# Patient Record
Sex: Female | Born: 1960 | Race: Black or African American | Hispanic: No | Marital: Single | Smoking: Never smoker
Health system: Southern US, Community
[De-identification: ages and names within clinical notes are randomized; demographics above are authoritative.]

## PROBLEM LIST (undated history)

## (undated) DIAGNOSIS — I10 Essential (primary) hypertension: Secondary | ICD-10-CM

## (undated) HISTORY — PX: TONSILLECTOMY: SUR1361

---

## 2006-06-22 ENCOUNTER — Ambulatory Visit: Payer: Self-pay

## 2007-10-26 ENCOUNTER — Ambulatory Visit: Payer: Self-pay

## 2009-05-01 ENCOUNTER — Ambulatory Visit: Payer: Self-pay

## 2010-07-02 ENCOUNTER — Emergency Department: Payer: Self-pay | Admitting: Emergency Medicine

## 2010-07-09 ENCOUNTER — Ambulatory Visit: Payer: Self-pay

## 2010-10-19 ENCOUNTER — Emergency Department: Payer: Self-pay | Admitting: Emergency Medicine

## 2011-08-20 ENCOUNTER — Ambulatory Visit: Payer: Self-pay | Admitting: Family Medicine

## 2012-12-05 ENCOUNTER — Ambulatory Visit: Payer: Self-pay

## 2013-07-17 IMAGING — MG MM DIGITAL SCREENING BILAT W/ CAD
1 series · 4 of 4 positions shown · non-contrast
Comparison: none

REASON FOR EXAM: [facility] scr mammo no order
COMMENTS:

[Series 7519: R CC · right · 4 of 4 slices shown]
[im 1/4]
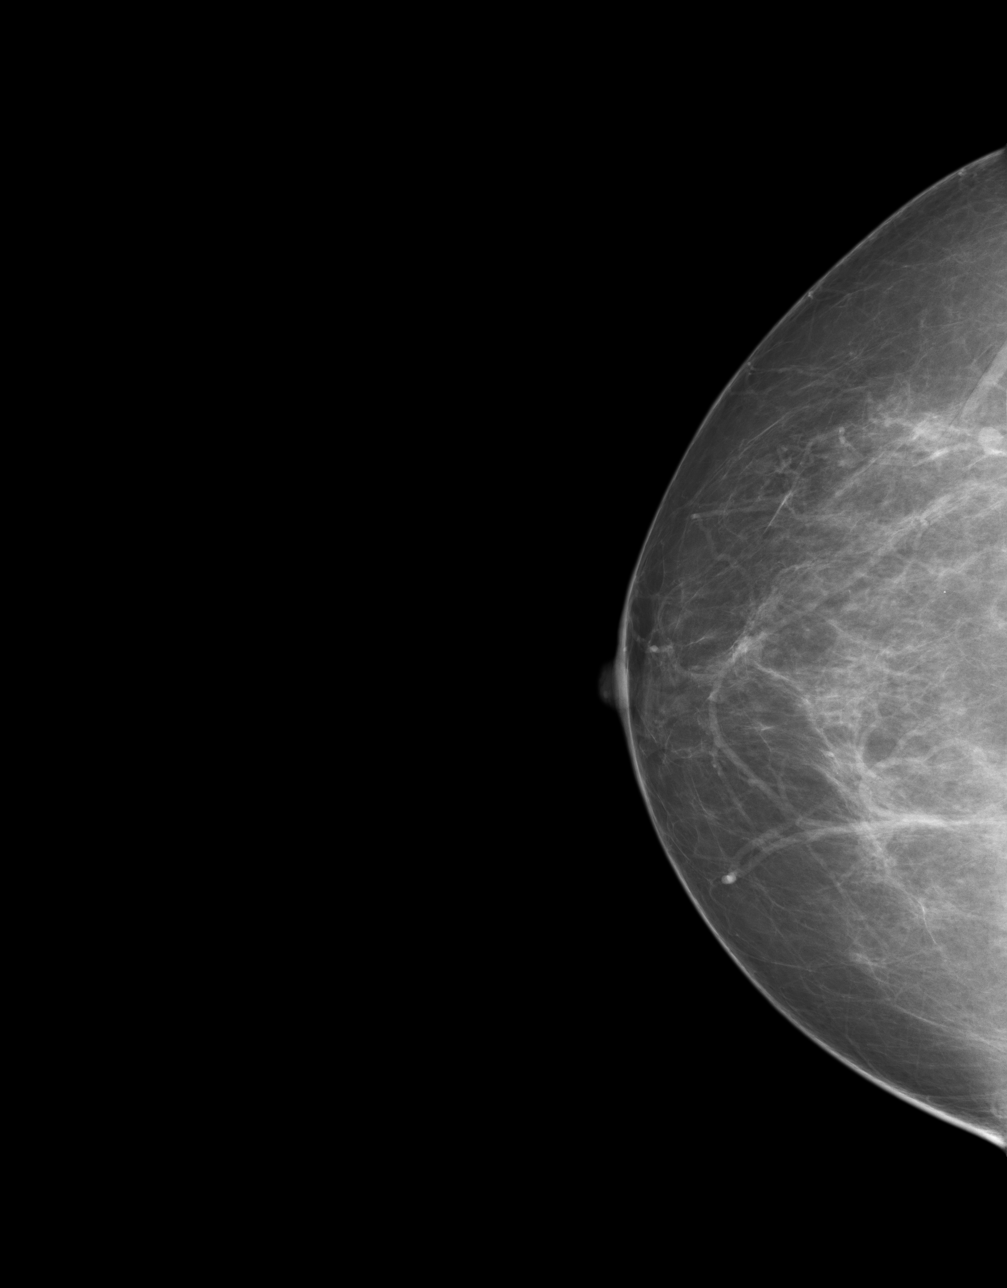
[im 2/4]
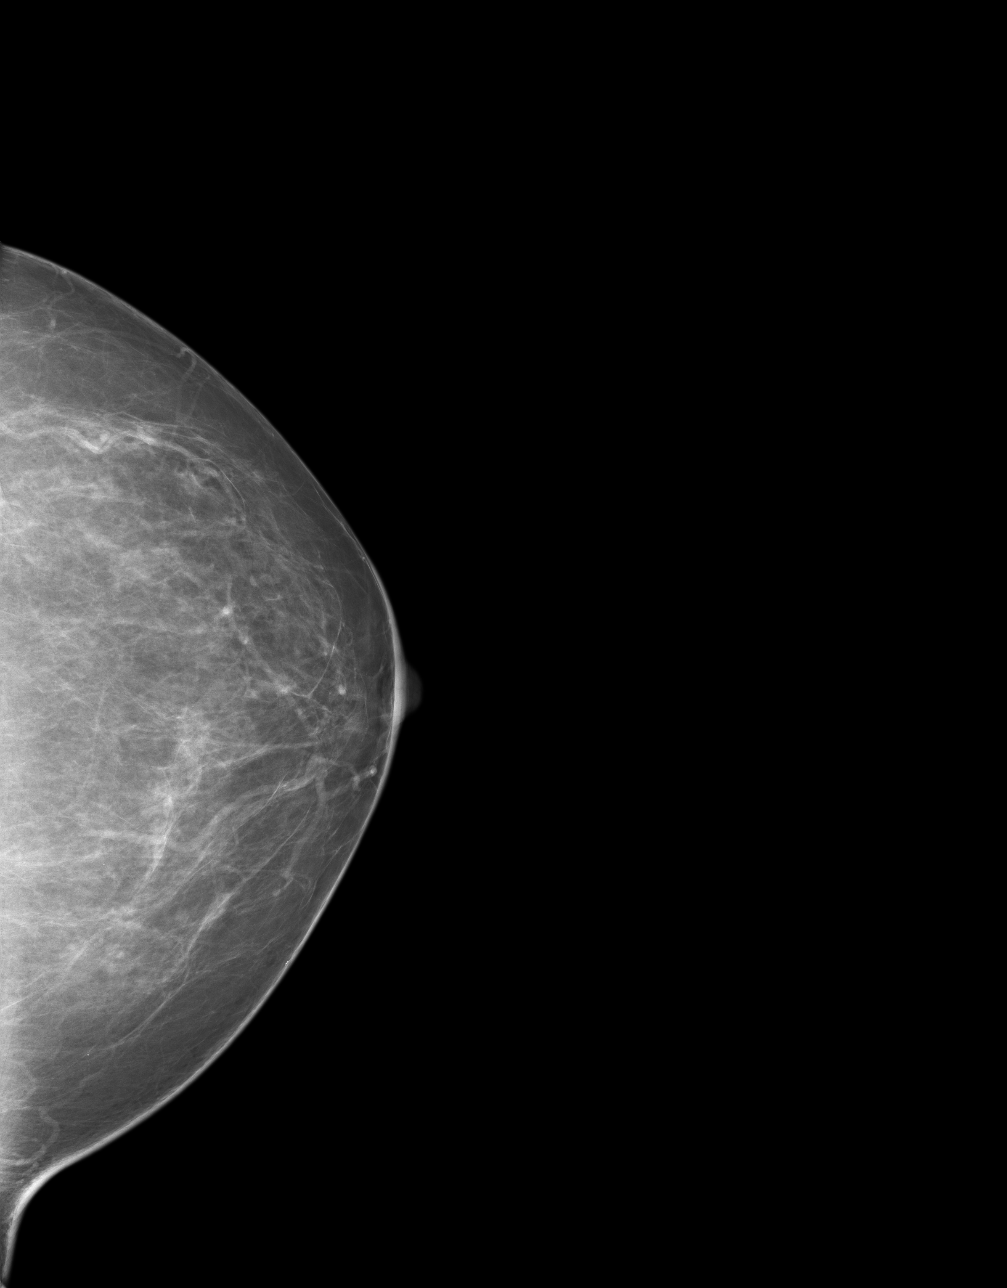
[im 3/4]
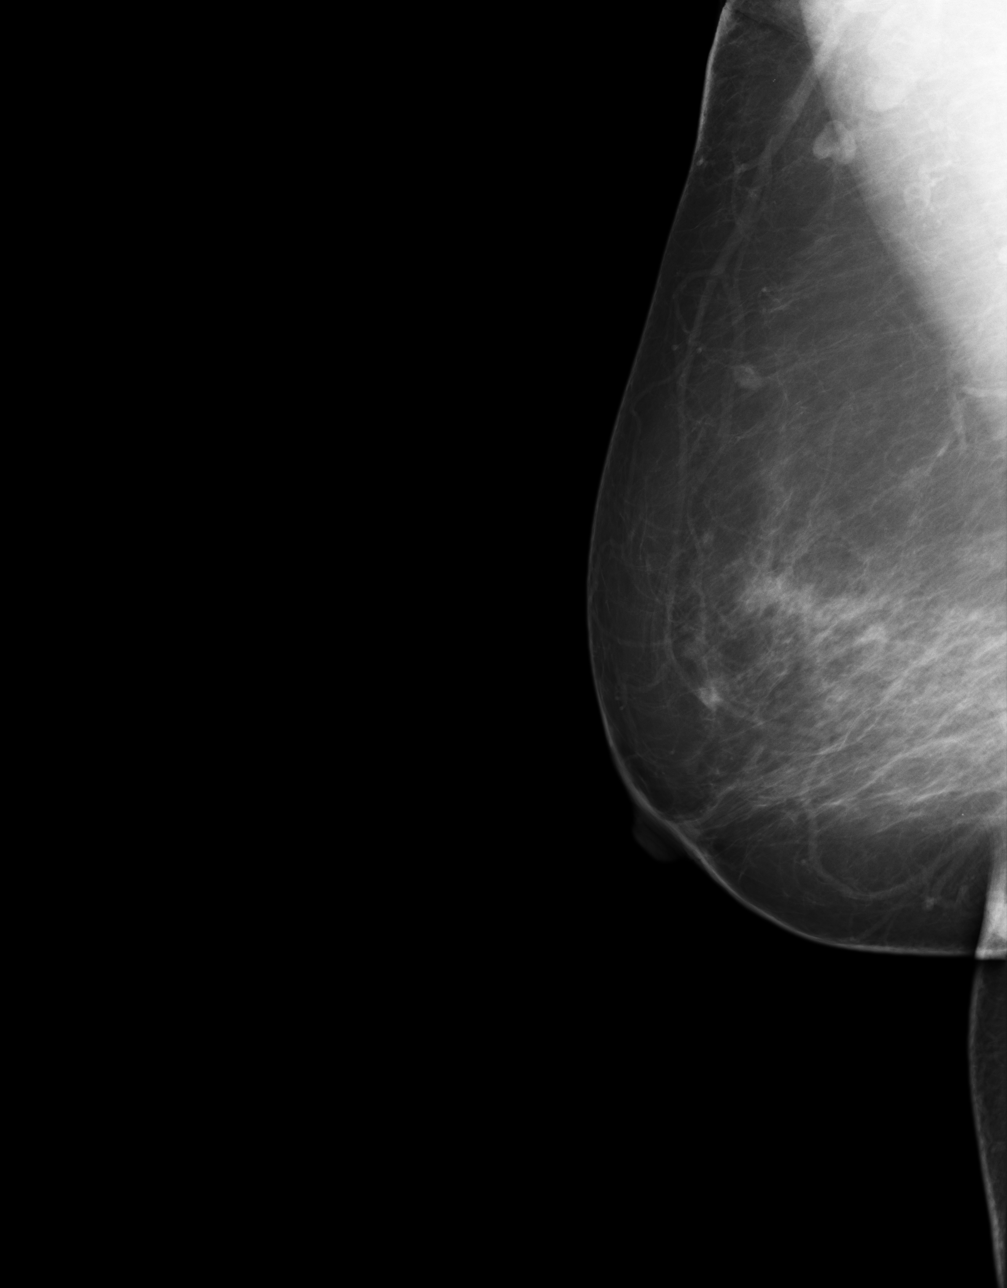
[im 4/4]
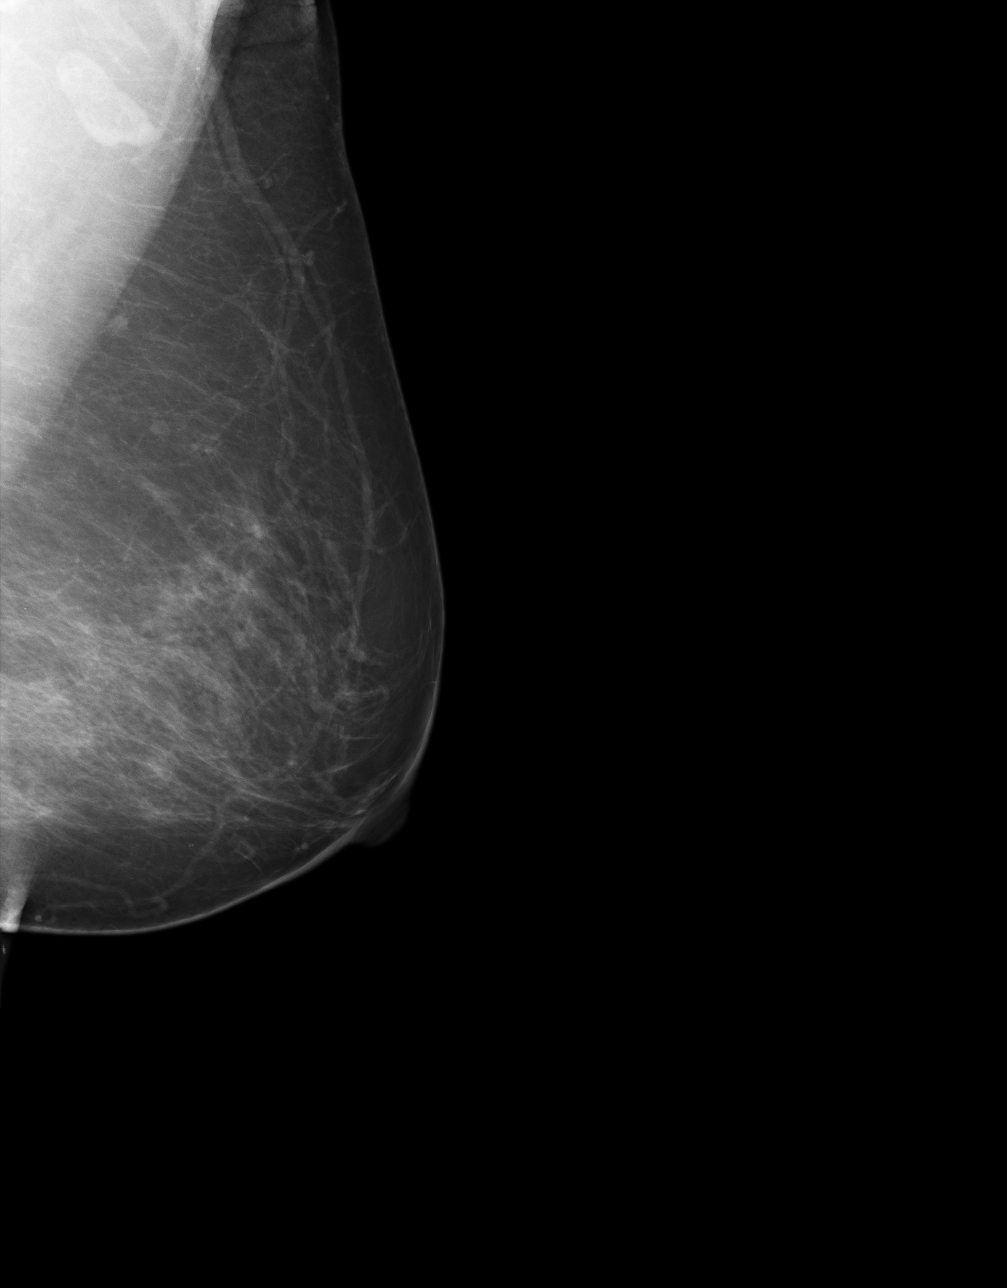

[4 of 4 positions shown; findings below may reference images not displayed]

PROCEDURE:     MAM - MAM SEON DGT SCR W/CAD NO ORDER  - August 20, 2011  [DATE]

RESULT:     Comparison is made to prior studies dated 07-09-2010 and
11-16-2002.

The breasts demonstrate a mild to moderately dense, heterogeneous
parenchymal pattern without radiographic evidence to suggest malignancy.
IMPRESSION: BI-RADS: Category 2 - Benign Findings

A NEGATIVE MAMMOGRAM REPORT DOES NOT PRECLUDE BIOPSY OR OTHER EVALUATION OF
A CLINICALLY PALPABLE OR OTHERWISE SUSPICIOUS MASS OR LESION. BREAST CANCER
MAY NOT BE DETECTED BY MAMMOGRAPHY IN UP TO 10% OF CASES.

## 2013-12-06 ENCOUNTER — Ambulatory Visit: Payer: Self-pay

## 2014-11-02 IMAGING — MG MM DIGITAL SCREENING BILAT W/ CAD
1 series · 4 of 4 positions shown · non-contrast
Comparison: Previous exam(s).

REASON FOR EXAM: SCR MAMMO [REDACTED]
COMMENTS:

PROCEDURE:     MAM - MAM [REDACTED] EXAR DIG SCREEN W/CAD  - December 05, 2012  [DATE]
CLINICAL DATA: Screening.
DIGITAL SCREENING BILATERAL MAMMOGRAM WITH CAD

[R CC · right · 4 of 4 slices shown]
[im 1/4]
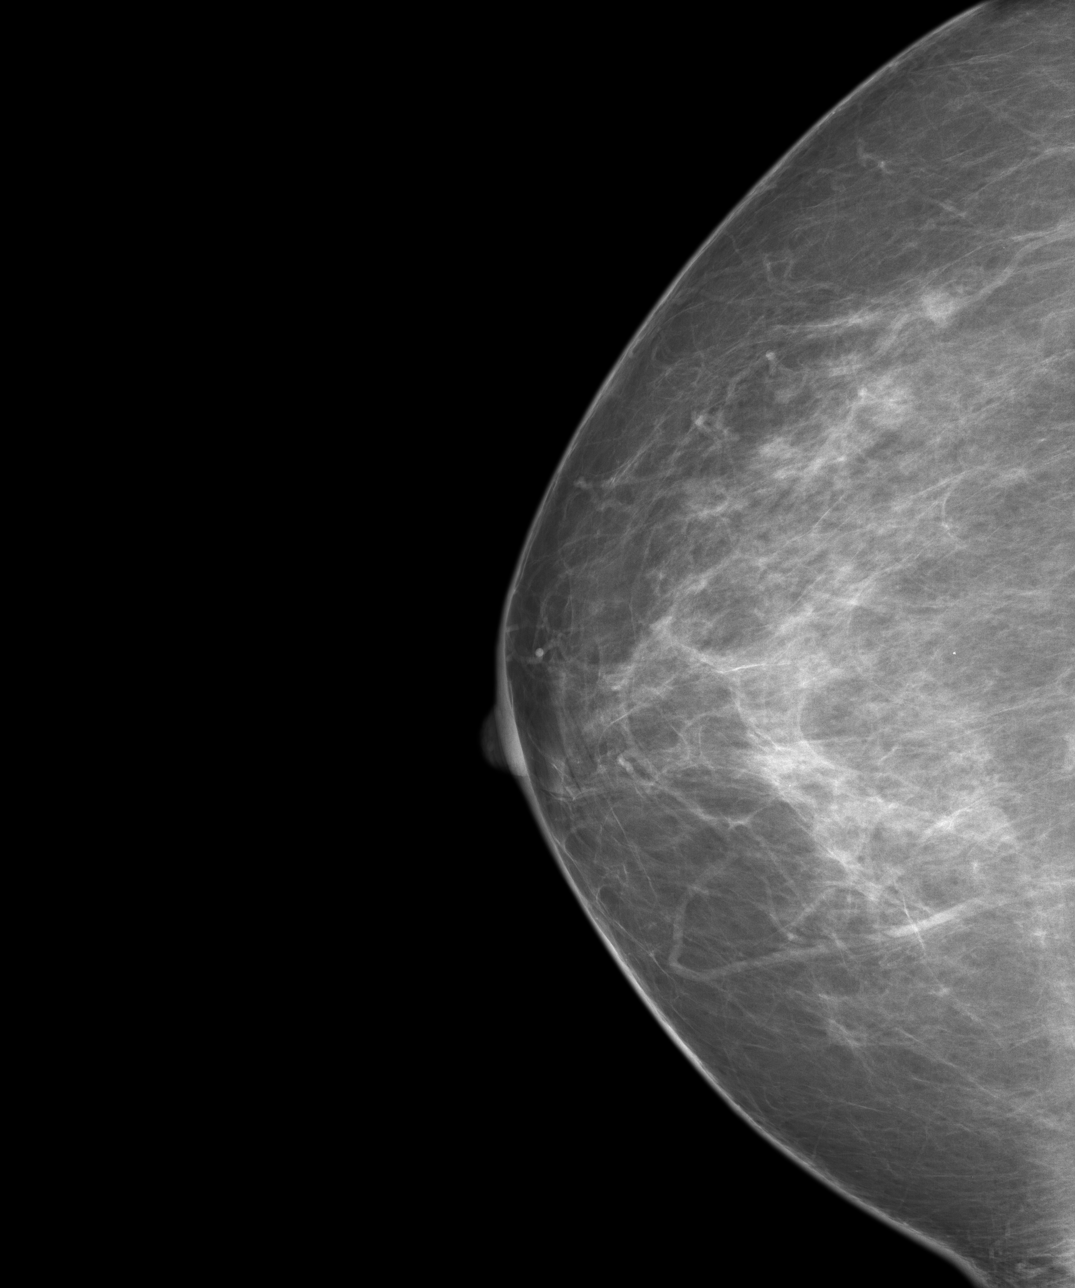
[im 2/4]
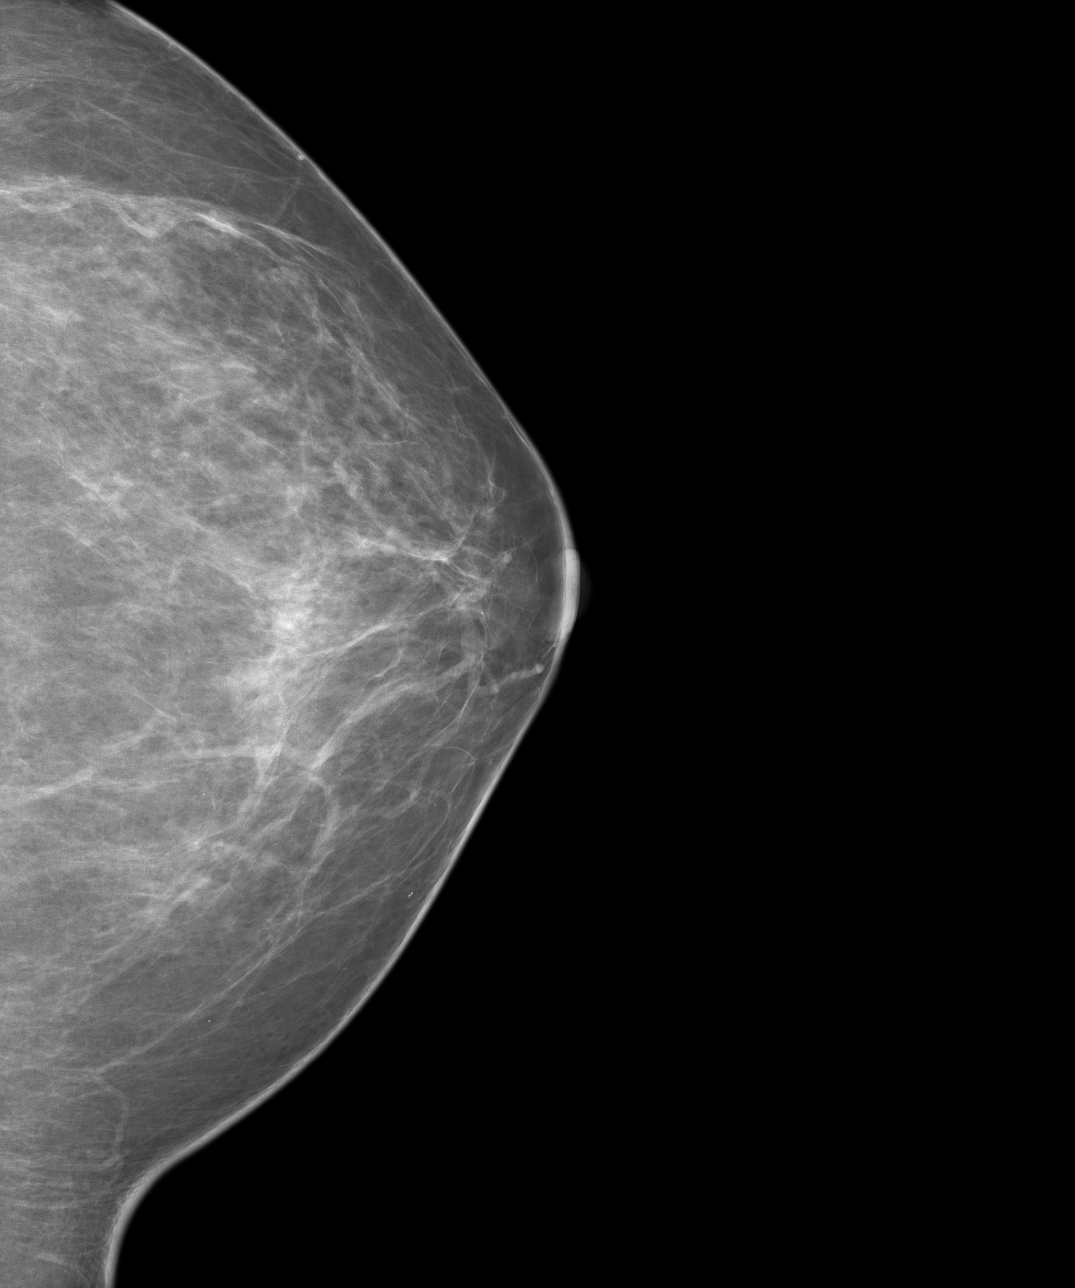
[im 3/4]
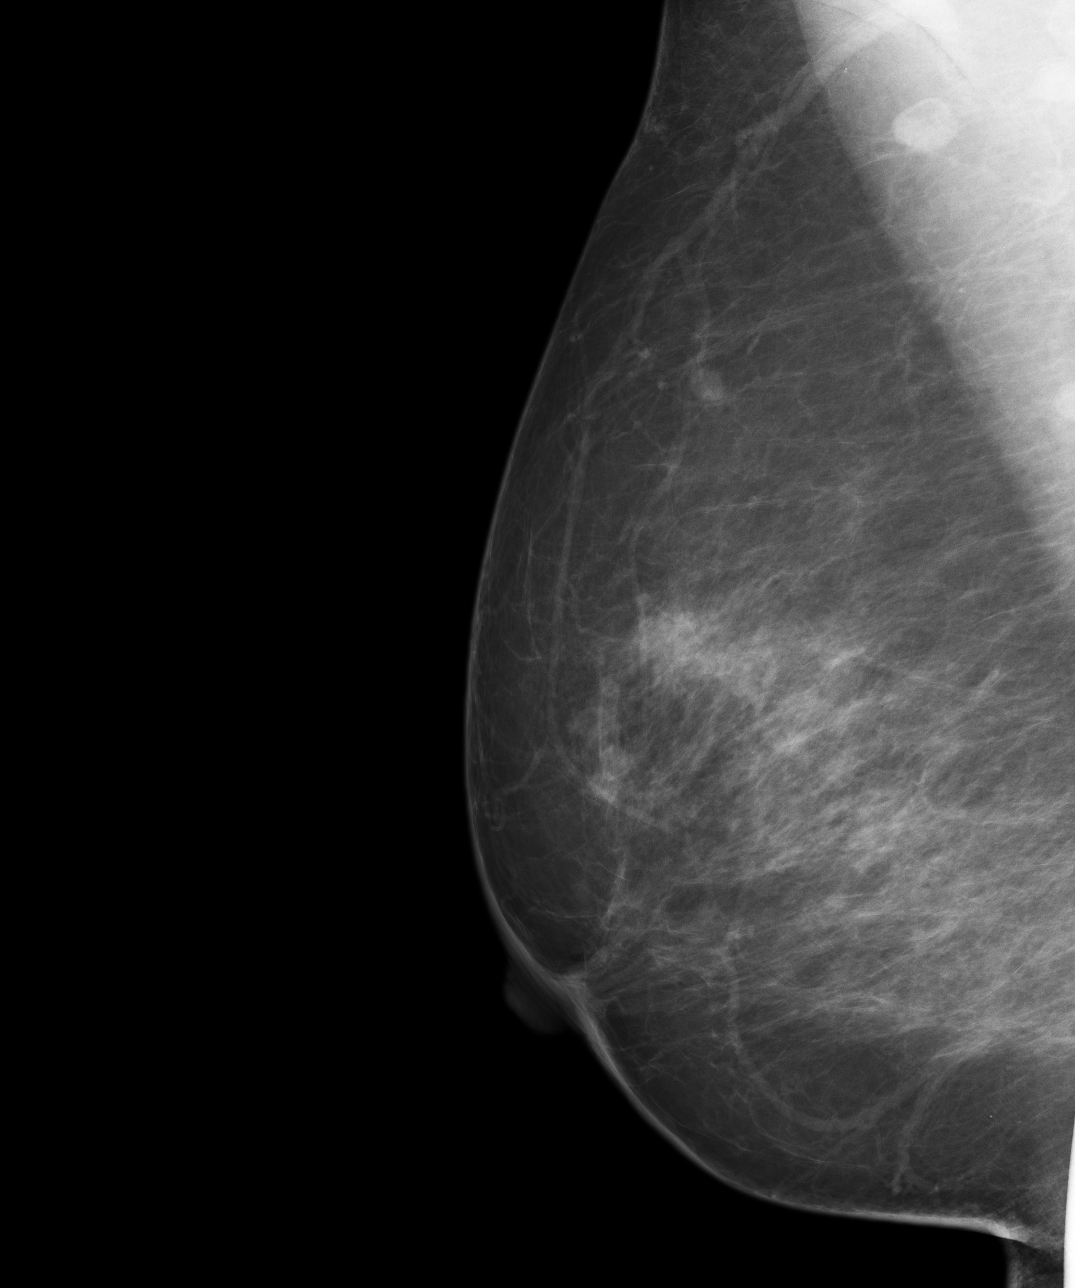
[im 4/4]
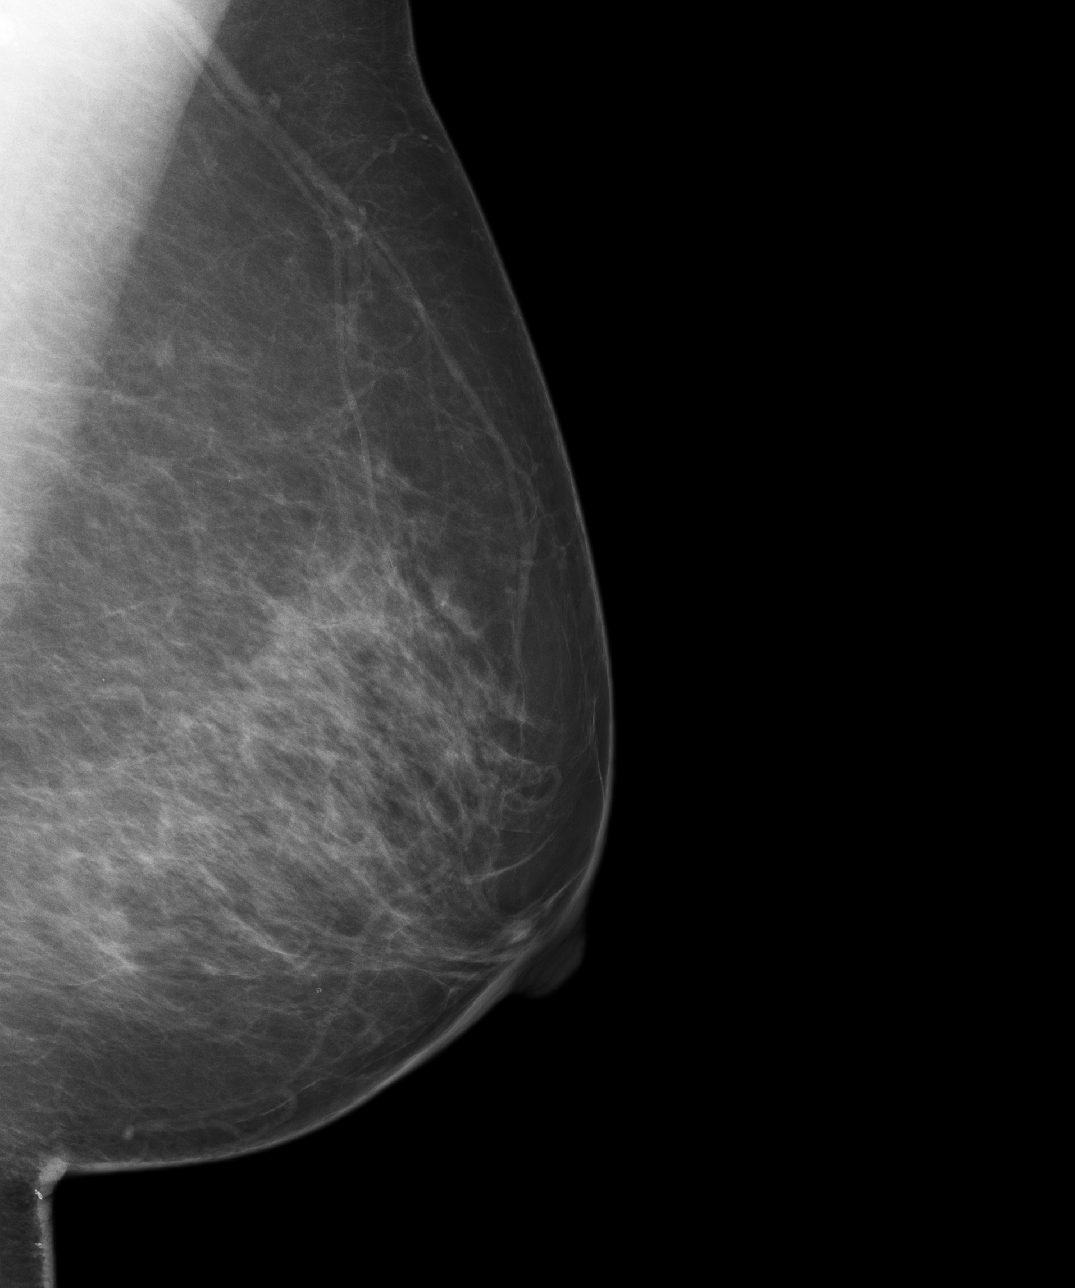

[4 of 4 positions shown; findings below may reference images not displayed]

FINDINGS: ACR Breast Density Category c:  The breast tissue is
heterogeneously dense, which may obscure small masses.

There are no findings suspicious for malignancy.

Images were processed with CAD.
IMPRESSION: No mammographic evidence of malignancy.

A result letter of this screening mammogram will be mailed directly
to the patient.

RECOMMENDATION:
Screening mammogram in one year. (Code:QN-7-P4M)

BI-RADS CATEGORY 1:  Negative.

## 2014-12-31 ENCOUNTER — Ambulatory Visit: Payer: Self-pay

## 2015-01-14 ENCOUNTER — Ambulatory Visit: Payer: Self-pay | Attending: Oncology | Admitting: *Deleted

## 2015-01-14 ENCOUNTER — Encounter: Payer: Self-pay | Admitting: *Deleted

## 2015-01-14 ENCOUNTER — Ambulatory Visit
Admission: RE | Admit: 2015-01-14 | Discharge: 2015-01-14 | Disposition: A | Discharge status: Home / Self Care | Payer: Self-pay | Source: Ambulatory Visit | Attending: Oncology | Admitting: Oncology

## 2015-01-14 VITALS — BP 125/81 | HR 54 | Temp 96.3°F | Ht 60.0 in | Wt 159.4 lb

## 2015-01-14 DIAGNOSIS — Z0000 Encounter for general adult medical examination without abnormal findings: Secondary | ICD-10-CM

## 2015-01-14 NOTE — Progress Notes (Signed)
Subjective:     Patient ID: Debra Cervantes, female   DOB: 12/12/1960, 54 y.o.   MRN: [id number]  HPI   Review of Systems     Objective:   Physical Exam  Pulmonary/Chest: Right breast exhibits no inverted nipple, no mass, no nipple discharge, no skin change and no tenderness. Left breast exhibits no inverted nipple, no mass, no nipple discharge, no skin change and no tenderness. Breasts are symmetrical.       Assessment:     54 year old Black [facility]  Clinical breast exam unremarkable.  Taught self breast awareness.  Patient has been screened for eligibility.  She does not have any insurance, Medicare or Medicaid.  She also meets financial eligibility.  Hand-out given on the Affordable Care Act.     Plan:     Screening mammogram ordered.  Will follow-up per protocol.

## 2015-01-14 NOTE — Progress Notes (Signed)
Letter mailed from [facility] to inform patient of her normal mammogram results.  Patient is to follow-up with annual screening in one year.  HSIS to Christy. 

## 2015-01-14 NOTE — Patient Instructions (Signed)
Gave patient hand-out, Women Staying Healthy, Active and Well from [facility], with education on breast health, pap smears, heart and colon health. 

## 2015-05-02 ENCOUNTER — Emergency Department
Admission: EM | Admit: 2015-05-02 | Discharge: 2015-05-02 | Disposition: A | Discharge status: Home / Self Care | Payer: Self-pay | Attending: Emergency Medicine | Admitting: Emergency Medicine

## 2015-05-02 ENCOUNTER — Encounter: Payer: Self-pay | Admitting: *Deleted

## 2015-05-02 DIAGNOSIS — M7551 Bursitis of right shoulder: Secondary | ICD-10-CM | POA: Insufficient documentation

## 2015-05-02 DIAGNOSIS — Z79899 Other long term (current) drug therapy: Secondary | ICD-10-CM | POA: Insufficient documentation

## 2015-05-02 DIAGNOSIS — I10 Essential (primary) hypertension: Secondary | ICD-10-CM | POA: Insufficient documentation

## 2015-05-02 HISTORY — DX: Essential (primary) hypertension: I10

## 2015-05-02 MED ORDER — MELOXICAM 15 MG PO TABS: 15.0000 mg | ORAL_TABLET | Freq: Every day | ORAL | Status: DC

## 2015-05-02 NOTE — ED Notes (Signed)
States she works in [employer] and states right shoulder pain for 1 week, states she feels like she pulled a muscle

## 2015-05-02 NOTE — ED Provider Notes (Signed)
[facility] Emergency Department Provider Note  ____________________________________________  Time seen: Approximately 5:08 PM  I have reviewed the triage vital signs and the nursing notes.   HISTORY  Chief Complaint Shoulder Pain    HPI Debra Cervantes is a 55 y.o. female who presents emergency department complaining of right shoulder pain. Patient states that pain has been present for approximately a week. She states pain began insidiously and is greatly increased over the intervening period. She denies any injury precipitating this pain. She denies any neck pain, numbness or tingling. She states there is pain with all ranges of motion. Pain is located in the upper arm lateral shoulder area. Pain is described as sharp, constant, greatly worsening with movement.   Past Medical History  Diagnosis Date  . Hypertension     There are no active problems to display for this patient.   History reviewed. No pertinent past surgical history.  Current Outpatient Rx  Name  Route  Sig  Dispense  Refill  . lisinopril (PRINIVIL,ZESTRIL) 10 MG tablet   Oral   Take 10 mg by mouth daily.         . meloxicam (MOBIC) 15 MG tablet   Oral   Take 1 tablet (15 mg total) by mouth daily.   30 tablet   0     Allergies Review of patient's allergies indicates no known allergies.  History reviewed. No pertinent family history.  Social History Social History  Substance Use Topics  . Smoking status: Never Smoker   . Smokeless tobacco: None  . Alcohol Use: None     Review of Systems  Constitutional: No fever/chills Cardiovascular: no chest pain. Respiratory: no cough. No SOB. Musculoskeletal: Negative for back pain. Positive for right shoulder pain. Skin: Negative for rash. Neurological: Negative for headaches, focal weakness or numbness. 10-point ROS otherwise negative.  ____________________________________________   PHYSICAL EXAM:  VITAL SIGNS: ED  Triage Vitals  Enc Vitals Group     BP 05/02/15 1637 127/86 mmHg     Pulse Rate 05/02/15 1637 83     Resp 05/02/15 1637 18     Temp 05/02/15 1637 98.6 F (37 C)     Temp Source 05/02/15 1637 Oral     SpO2 05/02/15 1637 99 %     Weight 05/02/15 1637 155 lb (70.308 kg)     Height 05/02/15 1637  (1.499 m)     Head Cir --      Peak Flow --      Pain Score 05/02/15 1637 10     Pain Loc --      Pain Edu? --      Excl. in GC? --      Constitutional: Alert and oriented. Well appearing and in no acute distress. Eyes: Conjunctivae are normal. PERRL. EOMI. Head: Atraumatic. Neck: No stridor.  No cervical spine tenderness to palpation. Hematological/Lymphatic/Immunilogical: No cervical lymphadenopathy. Cardiovascular: Normal rate, regular rhythm. Normal S1 and S2.  Good peripheral circulation. Respiratory: Normal respiratory effort without tachypnea or retractions. Lungs CTAB. Musculoskeletal: No lower extremity tenderness nor edema.  No joint effusions. Limited range of motion to in all planes to right shoulder. No visible abnormality. Patient is tender to palpation over the lateral aspect of the shoulder. No tenderness to palpation over the posterior shoulder. Pain with passive range of motion. Grip strength intact and equal without affected extremity. Radial pulse appreciated bilaterally. Sensation intact 5 digits in the left unaffected extremity. Neurologic:  Normal speech and  language. No gross focal neurologic deficits are appreciated.  Skin:  Skin is warm, dry and intact. No rash noted. Psychiatric: Mood and affect are normal. Speech and behavior are normal. Patient exhibits appropriate insight and judgement.   ____________________________________________   LABS (all labs ordered are listed, but only abnormal results are displayed)  Labs Reviewed - No data to  display ____________________________________________  EKG   ____________________________________________  RADIOLOGY   No results found.  ____________________________________________    PROCEDURES  Procedure(s) performed:       Medications - No data to display   ____________________________________________   INITIAL IMPRESSION / ASSESSMENT AND PLAN / ED COURSE  Pertinent labs & imaging results that were available during my care of the patient were reviewed by me and considered in my medical decision making (see chart for details).  Patient's diagnosis is consistent with right shoulder bursitis.. Patient will be discharged home with prescriptions for anti-inflammatories for symptom reduction. Patient is given a sling here in the emergency department but instructed to use pendulum exercises to prevent adhesive capsulitis.. Patient is to follow up with orthopedics if symptoms persist past this treatment course. Patient is given ED precautions to return to the ED for any worsening or new symptoms.     ____________________________________________  FINAL CLINICAL IMPRESSION(S) / ED DIAGNOSES  Final diagnoses:  Shoulder bursitis, right      NEW MEDICATIONS STARTED DURING THIS VISIT:  New Prescriptions   MELOXICAM (MOBIC) 15 MG TABLET    Take 1 tablet (15 mg total) by mouth daily.        Delorise Royals Cuthriell, PA-C 05/02/15 1716  Jeanmarie Plant, MD 05/03/15 [phone]

## 2015-05-02 NOTE — ED Notes (Signed)
States she hurt her right shoulder about 1 week ago..states she does repetitive movement  Works in [employer]

## 2015-05-02 NOTE — Discharge Instructions (Signed)
Bursitis °Bursitis is inflammation and irritation of a bursa, which is one of the small, fluid-filled sacs that cushion and protect the moving parts of your body. These sacs are located between bones and muscles, muscle attachments, or skin areas next to bones. A bursa protects these structures from the wear and tear that results from frequent movement. °An inflamed bursa causes pain and swelling. Fluid may build up inside the sac. Bursitis is most common near joints, especially the knees, elbows, hips, and shoulders. °CAUSES °Bursitis can be caused by:  °· Injury from: °¨ A direct blow, like falling on your knee or elbow. °¨ Overuse of a joint (repetitive stress). °· Infection. This can happen if bacteria gets into a bursa through a cut or scrape near a joint. °· Diseases that cause joint inflammation, such as gout and rheumatoid arthritis. °RISK FACTORS °You may be at risk for bursitis if you:  °· Have a job or hobby that involves a lot of repetitive stress on your joints. °· Have a condition that weakens your body's defense system (immune system), such as diabetes, cancer, or HIV. °· Lift and reach overhead often. °· Kneel or lean on hard surfaces often. °· Run or walk often. °SIGNS AND SYMPTOMS °The most common signs and symptoms of bursitis are: °· Pain that gets worse when you move the affected body part or put weight on it. °· Inflammation. °· Stiffness. °Other signs and symptoms may include: °· Redness. °· Tenderness. °· Warmth. °· Pain that continues after rest. °· Fever and chills. This may occur in bursitis caused by infection. °DIAGNOSIS °Bursitis may be diagnosed by:  °· Medical history and physical exam. °· MRI. °· A procedure to drain fluid from the bursa with a needle (aspiration). The fluid may be checked for signs of infection or gout. °· Blood tests to rule out other causes of inflammation. °TREATMENT  °Bursitis can usually be treated at home with rest, ice, compression, and elevation (RICE). For  mild bursitis, RICE treatment may be all you need. Other treatments may include: °· Nonsteroidal anti-inflammatory drugs (NSAIDs) to treat pain and inflammation. °· Corticosteroids to fight inflammation. You may have these drugs injected into and around the area of bursitis. °· Aspiration of bursitis fluid to relieve pain and improve movement. °· Antibiotic medicine to treat an infected bursa. °· A splint, brace, or walking aid. °· Physical therapy if you continue to have pain or limited movement. °· Surgery to remove a damaged or infected bursa. This may be needed if you have a very bad case of bursitis or if other treatments have not worked. °HOME CARE INSTRUCTIONS  °· Take medicines only as directed by your health care provider. °· If you were prescribed an antibiotic medicine, finish it all even if you start to feel better. °· Rest the affected area as directed by your health care provider. °¨ Keep the area elevated. °¨ Avoid activities that make pain worse. °· Apply ice to the injured area: °¨ Place ice in a plastic bag. °¨ Place a towel between your skin and the bag. °¨ Leave the ice on for 20 minutes, 2-3 times a day. °· Use splints, braces, pads, or walking aids as directed by your health care provider. °· Keep all follow-up visits as directed by your health care provider. This is important. °PREVENTION  °· Wear knee pads if you kneel often. °· Wear sturdy running or walking shoes that fit you well. °· Take regular breaks from repetitive activity. °· Warm   up by stretching before doing any strenuous activity. °· Maintain a healthy weight or lose weight as recommended by your health care provider. Ask your health care provider if you need help. °· Exercise regularly. Start any new physical activity gradually. °SEEK MEDICAL CARE IF:  °· Your bursitis is not responding to treatment or home care. °· You have a fever. °· You have chills. °  °This information is not intended to replace advice given to you by your  health care provider. Make sure you discuss any questions you have with your health care provider. °  °Document Released: 03/06/2000 Document Revised: 11/28/2014 Document Reviewed: 05/29/2013 °Elsevier Interactive Patient Education ©2016 [employer] Inc[employer] ° °

## 2015-11-03 IMAGING — MG MM DIGITAL SCREENING BILAT W/ CAD
1 series · 4 of 4 positions shown · non-contrast
Comparison: Previous exam(s).

CLINICAL DATA: Screening.

EXAM:
DIGITAL SCREENING BILATERAL MAMMOGRAM WITH CAD

[R CC · right · 4 of 4 slices shown]
[im 1/4]
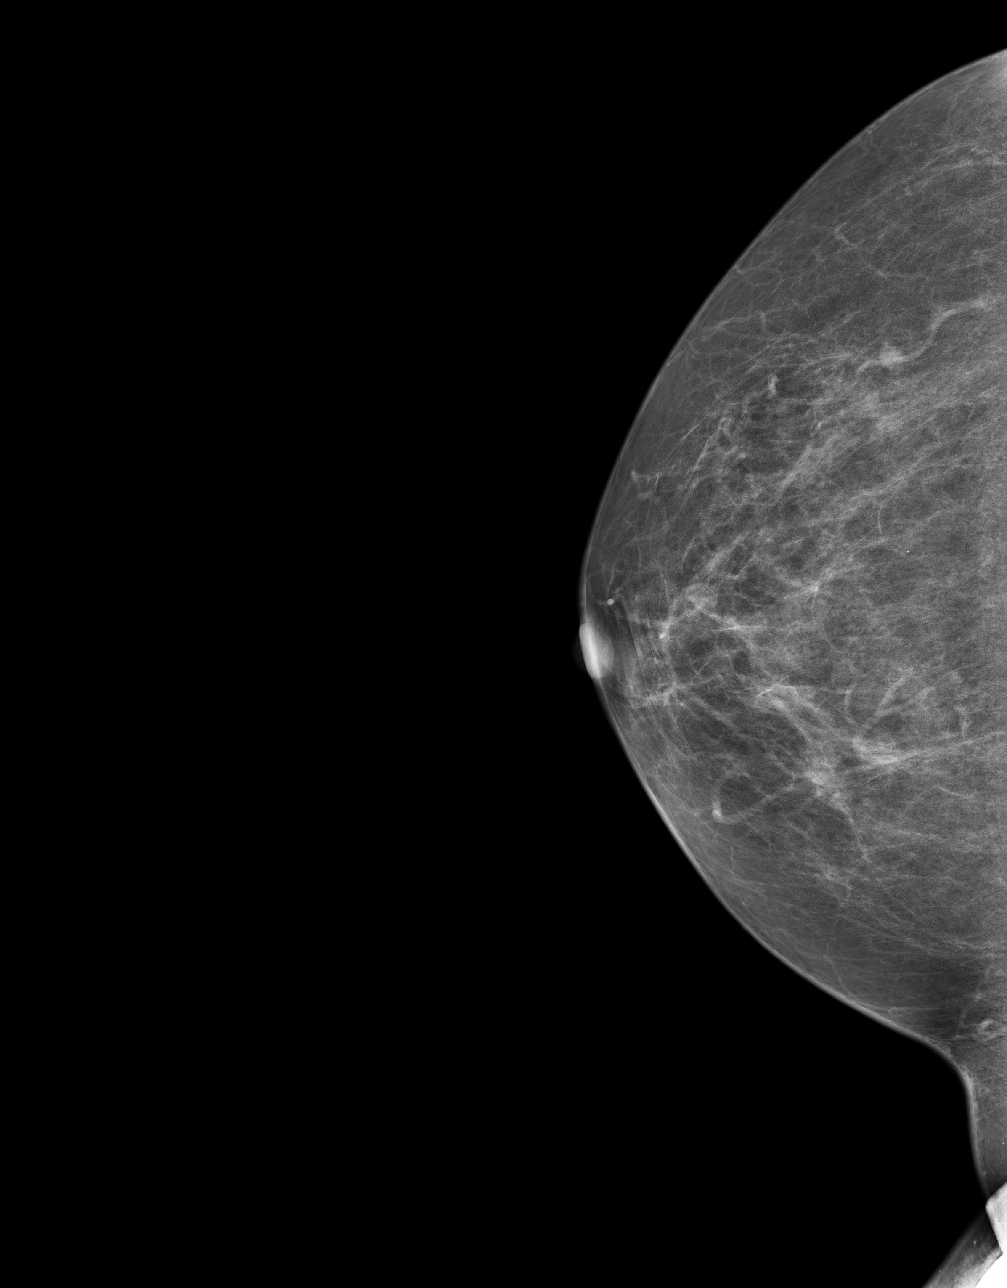
[im 2/4]
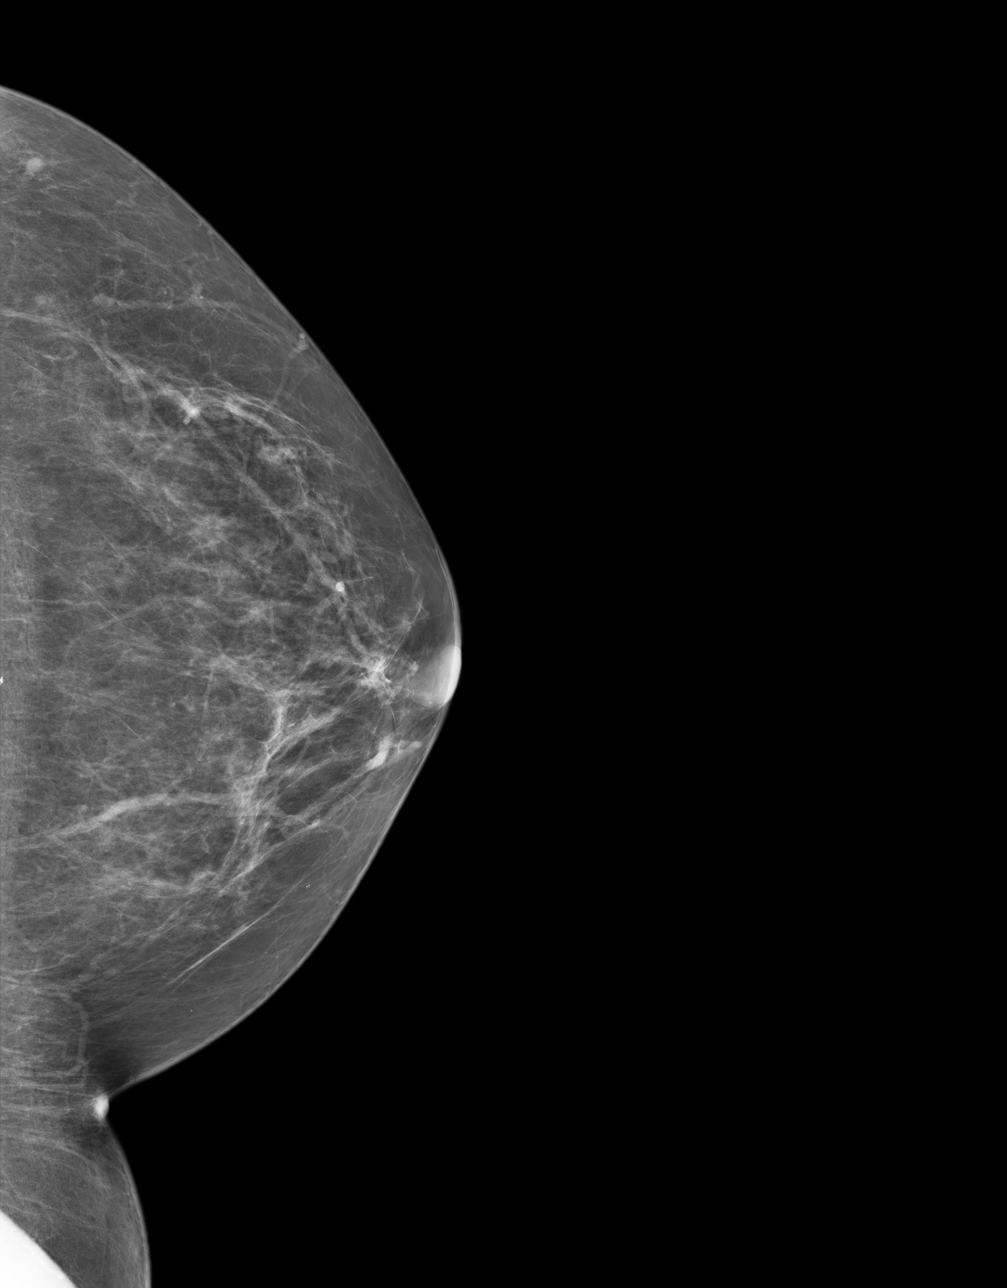
[im 3/4]
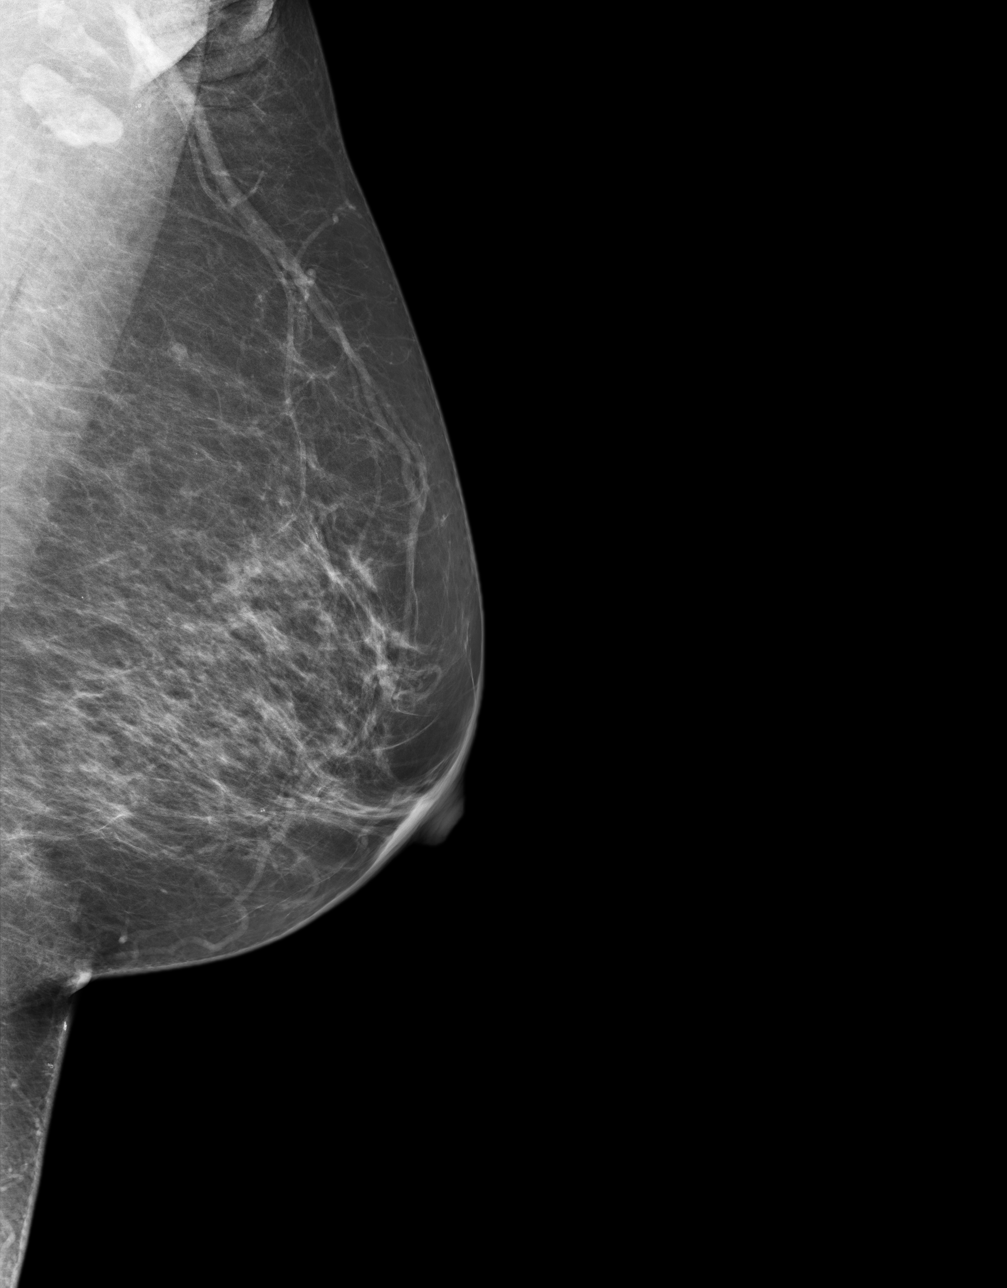
[im 4/4]
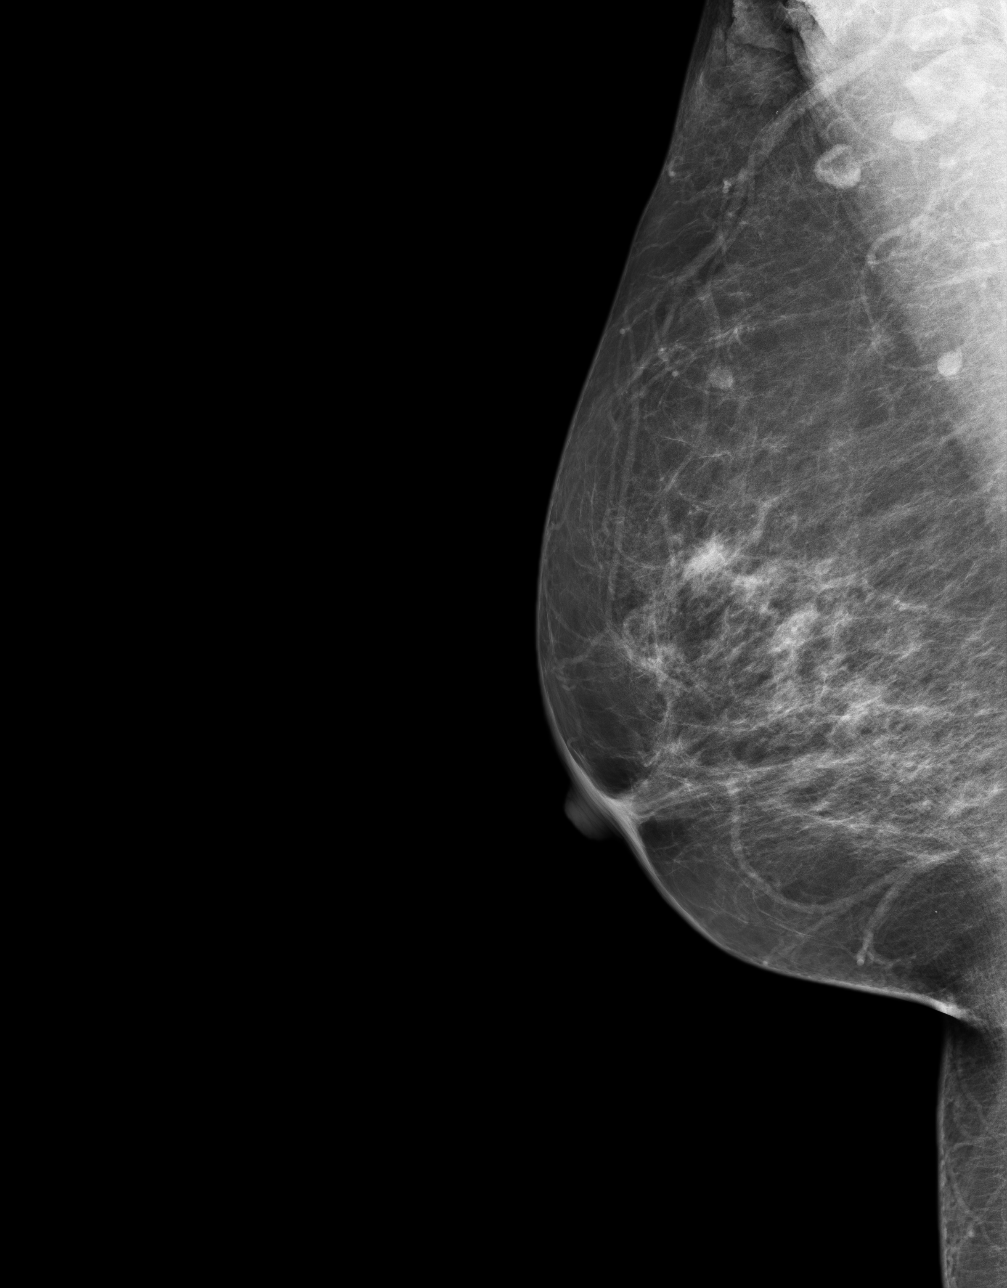

[4 of 4 positions shown; findings below may reference images not displayed]

ACR Breast Density Category b: There are scattered areas of
fibroglandular density.
FINDINGS: There are no findings suspicious for malignancy. Images were
processed with CAD.
IMPRESSION: No mammographic evidence of malignancy. A result letter of this
screening mammogram will be mailed directly to the patient.

RECOMMENDATION:
Screening mammogram in one year. (Code:AS-G-LCT)

BI-RADS CATEGORY  1: Negative.

## 2016-01-06 ENCOUNTER — Encounter (INDEPENDENT_AMBULATORY_CARE_PROVIDER_SITE_OTHER): Payer: Self-pay

## 2016-01-06 ENCOUNTER — Ambulatory Visit: Payer: Self-pay | Attending: Oncology

## 2016-01-06 ENCOUNTER — Ambulatory Visit
Admission: RE | Admit: 2016-01-06 | Discharge: 2016-01-06 | Disposition: A | Discharge status: Home / Self Care | Payer: Self-pay | Source: Ambulatory Visit | Attending: Oncology | Admitting: Oncology

## 2016-01-06 VITALS — BP 125/81 | HR 63 | Temp 96.3°F | Ht 59.45 in | Wt 161.5 lb

## 2016-01-06 DIAGNOSIS — Z0000 Encounter for general adult medical examination without abnormal findings: Secondary | ICD-10-CM

## 2016-01-06 DIAGNOSIS — Z1231 Encounter for screening mammogram for malignant neoplasm of breast: Secondary | ICD-10-CM | POA: Insufficient documentation

## 2016-01-06 NOTE — Progress Notes (Signed)
Subjective:     Patient ID: Verne Carroweresa A Malizia, female   DOB: 02/03/1961, 55 y.o.   MRN: [id number]  HPI   Review of Systems     Objective:   Physical Exam  Pulmonary/Chest: Right breast exhibits no inverted nipple, no mass, no nipple discharge, no skin change and no tenderness. Left breast exhibits no inverted nipple, no mass, no nipple discharge, no skin change and no tenderness. Breasts are symmetrical.       Assessment:     55 year old [facility]  Patient screened, and meets [facility] eligibility.  Patient does not have insurance, Medicare or Medicaid.  Handout given on Affordable Care Act.  Instructed patient on breast self-exam using teach back method.  CBE unremarkable.  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    Plan:     Sent for bilateral screening mammogram.

## 2016-01-07 NOTE — Progress Notes (Signed)
Letter mailed from [facility] to notify of normal mammogram results.  Patient to return in one year for annual screening.  Copy to HSIS. 

## 2016-05-25 ENCOUNTER — Encounter: Payer: Self-pay | Admitting: Emergency Medicine

## 2016-05-25 ENCOUNTER — Emergency Department
Admission: EM | Admit: 2016-05-25 | Discharge: 2016-05-25 | Disposition: A | Discharge status: Home / Self Care | Payer: BLUE CROSS/BLUE SHIELD | Attending: Emergency Medicine | Admitting: Emergency Medicine

## 2016-05-25 DIAGNOSIS — Y929 Unspecified place or not applicable: Secondary | ICD-10-CM | POA: Insufficient documentation

## 2016-05-25 DIAGNOSIS — Y999 Unspecified external cause status: Secondary | ICD-10-CM | POA: Diagnosis not present

## 2016-05-25 DIAGNOSIS — Z79899 Other long term (current) drug therapy: Secondary | ICD-10-CM | POA: Diagnosis not present

## 2016-05-25 DIAGNOSIS — I10 Essential (primary) hypertension: Secondary | ICD-10-CM | POA: Diagnosis not present

## 2016-05-25 DIAGNOSIS — W231XXA Caught, crushed, jammed, or pinched between stationary objects, initial encounter: Secondary | ICD-10-CM | POA: Diagnosis not present

## 2016-05-25 DIAGNOSIS — S6991XA Unspecified injury of right wrist, hand and finger(s), initial encounter: Secondary | ICD-10-CM | POA: Diagnosis present

## 2016-05-25 DIAGNOSIS — Z7982 Long term (current) use of aspirin: Secondary | ICD-10-CM | POA: Insufficient documentation

## 2016-05-25 DIAGNOSIS — Y939 Activity, unspecified: Secondary | ICD-10-CM | POA: Insufficient documentation

## 2016-05-25 DIAGNOSIS — S60111A Contusion of right thumb with damage to nail, initial encounter: Secondary | ICD-10-CM | POA: Insufficient documentation

## 2016-05-25 DIAGNOSIS — S6010XA Contusion of unspecified finger with damage to nail, initial encounter: Secondary | ICD-10-CM

## 2016-05-25 MED ORDER — IBUPROFEN 200 MG PO TABS: 400.0000 mg | ORAL_TABLET | Freq: Four times a day (QID) | ORAL | 0 refills | Status: AC | PRN

## 2016-05-25 NOTE — ED Provider Notes (Signed)
[facility] Emergency Department Provider Note  ____________________________________________  Time seen: Approximately 6:34 PM  I have reviewed the triage vital signs and the nursing notes.   HISTORY  Chief Complaint Hand Pain    HPI Debra Cervantes is a 56 y.o. female that presents to the Emergency Department with right thumb pain for one week. Patient states that she got her finger caught in a car door. She states that the tip of her finger has been throbbing since injury. Patient states there has been some swelling. No alleviating measures have been tried. Patient is not concerned that it is broken. No additional injuries. No fever, shortness of breath, chest pain, nausea, vomiting, abdominal pain.   Past Medical History:  Diagnosis Date  . Hypertension     There are no active problems to display for this patient.   History reviewed. No pertinent surgical history.  Prior to Admission medications   Medication Sig Start Date End Date Taking? Authorizing Provider  aspirin 81 MG chewable tablet Chew 81 mg by mouth daily.   Yes Historical Provider, MD  ibuprofen (MOTRIN IB) 200 MG tablet Take 2 tablets (400 mg total) by mouth every 6 (six) hours as needed. 05/25/16 05/25/17  Enid DerryAshley Sharonica Kraszewski, PA-C  lisinopril (PRINIVIL,ZESTRIL) 10 MG tablet Take 10 mg by mouth daily.    Historical Provider, MD    Allergies Patient has no known allergies.  Family History  Problem Relation Age of Onset  . Breast cancer Neg Hx     Social History Social History  Substance Use Topics  . Smoking status: Never Smoker  . Smokeless tobacco: Never Used  . Alcohol use No     Review of Systems  Constitutional: No fever/chills ENT: No upper respiratory complaints. Cardiovascular: No chest pain. Respiratory: No cough. No SOB. Gastrointestinal: No abdominal pain.  No nausea, no vomiting.  Skin: Negative for rash, abrasions, lacerations. Neurological: Negative for headaches,  numbness or tingling   ____________________________________________   PHYSICAL EXAM:  VITAL SIGNS: ED Triage Vitals  Enc Vitals Group     BP 05/25/16 1647 125/81     Pulse Rate 05/25/16 1647 82     Resp 05/25/16 1647 20     Temp 05/25/16 1647 98.1 F (36.7 C)     Temp Source 05/25/16 1647 Oral     SpO2 05/25/16 1647 100 %     Weight 05/25/16 1641 165 lb (74.8 kg)     Height 05/25/16 1641 4\' 11"  (1.499 m)     Head Circumference --      Peak Flow --      Pain Score 05/25/16 1641 3     Pain Loc --      Pain Edu? --      Excl. in GC? --      Eyes: Conjunctivae are normal. PERRL. EOMI. Head: Atraumatic. ENT:      Ears:      Nose: No congestion/rhinnorhea.      Mouth/Throat: Mucous membranes are moist.  Neck: No stridor.   Cardiovascular: Normal rate, regular rhythm.  Good peripheral circulation. Respiratory: Normal respiratory effort without tachypnea or retractions. Lungs CTAB. Good air entry to the bases with no decreased or absent breath sounds. Musculoskeletal: Full range of motion to all extremities. No gross deformities appreciated. Neurologic:  Normal speech and language. No gross focal neurologic deficits are appreciated.  Skin:  Skin is warm, dry. Blood noted under right thumb fingernail. No tenderness to palpation over thumb.  ____________________________________________   LABS (all labs ordered are listed, but only abnormal results are displayed)  Labs Reviewed - No data to display ____________________________________________  EKG   ____________________________________________  RADIOLOGY  No results found.  ____________________________________________    PROCEDURES  Procedure(s) performed:    Procedures  Trephination with needle was performed on right thumb fingernail and blood was drained.  Medications - No data to display   ____________________________________________   INITIAL IMPRESSION / ASSESSMENT AND PLAN / ED  COURSE  Pertinent labs & imaging results that were available during my care of the patient were reviewed by me and considered in my medical decision making (see chart for details).  Review of the [place] CSRS was performed in accordance of the [facility] prior to dispensing any controlled drugs.     Patient's diagnosis is consistent with subungual hematoma. Vital signs and exam are reassuring. After blood was drained from under her fingernail, patient states that finger is feeling better. I do not think that imaging of the thumb is necessary and patient agrees. Patient will be discharged home with prescriptions for ibuprofen. Patient is to follow up with PCP as directed. Patient is given ED precautions to return to the ED for any worsening or new symptoms.     ____________________________________________  FINAL CLINICAL IMPRESSION(S) / ED DIAGNOSES  Final diagnoses:  Subungual contusion of fingernail, initial encounter      NEW MEDICATIONS STARTED DURING THIS VISIT:  Discharge Medication List as of 05/25/2016  5:27 PM    START taking these medications   Details  ibuprofen (MOTRIN IB) 200 MG tablet Take 2 tablets (400 mg total) by mouth every 6 (six) hours as needed., Starting Mon 05/25/2016, Until Tue 05/25/2017, Print            This chart was dictated using voice recognition software/Dragon. Despite best efforts to proofread, errors can occur which can change the meaning. Any change was purely unintentional.    Enid Derry, PA-C 05/25/16 1840    Sharman Cheek, MD 05/29/16 [phone]

## 2016-05-25 NOTE — ED Triage Notes (Signed)
States she shut her right thumb in car door about 1 week ago  conts to have some pain with some swelling noted at tip of thumb

## 2016-12-11 IMAGING — MG MM DIGITAL SCREENING BILATERAL
4 series · 4 of 4 positions shown · non-contrast
Comparison: Previous exam(s).

CLINICAL DATA: Screening.

EXAM:
DIGITAL SCREENING BILATERAL MAMMOGRAM WITH CAD

[L CC]
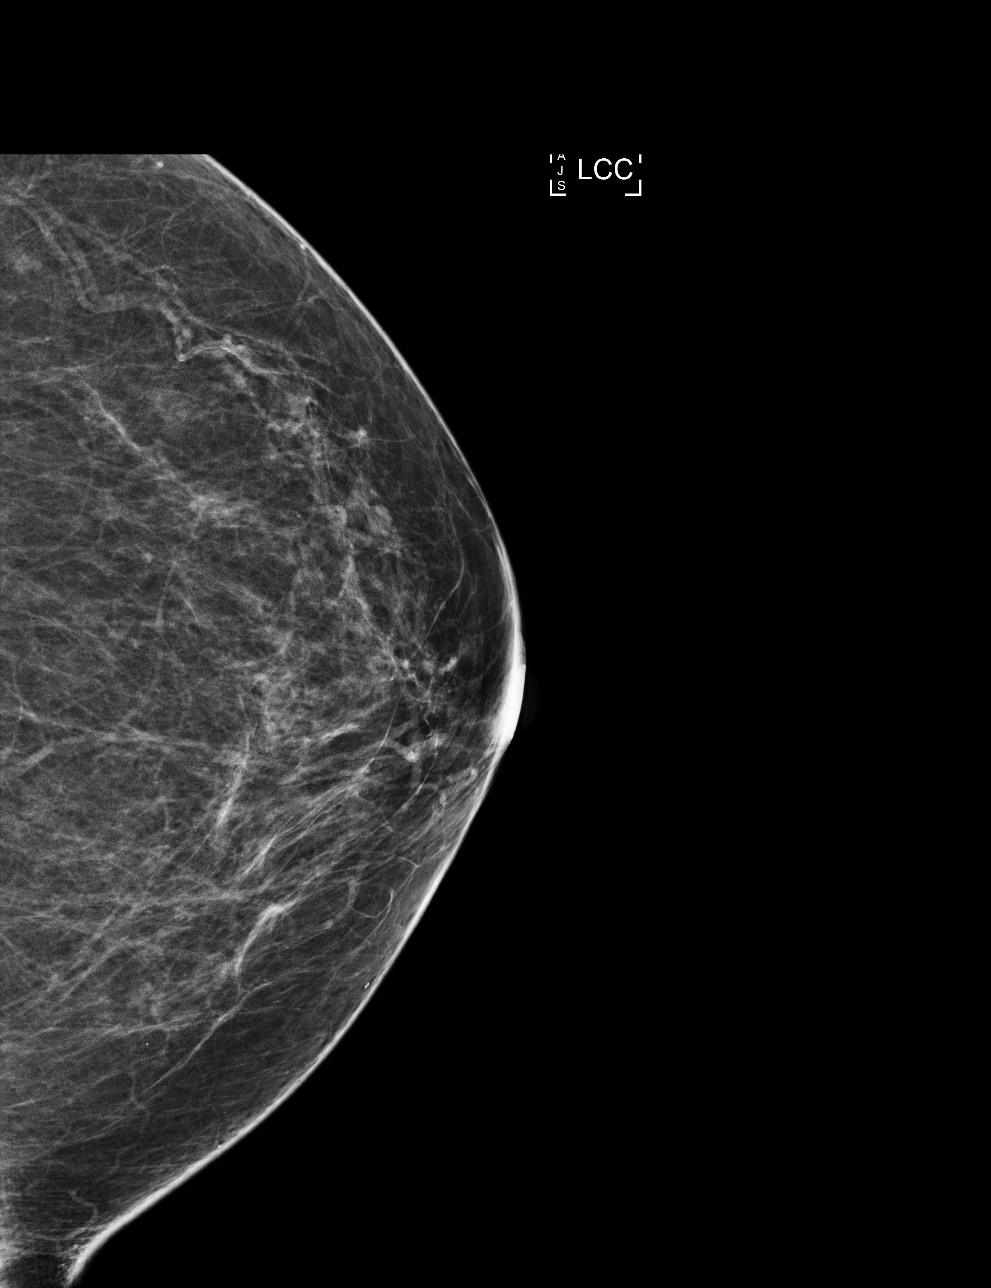

[R CC]
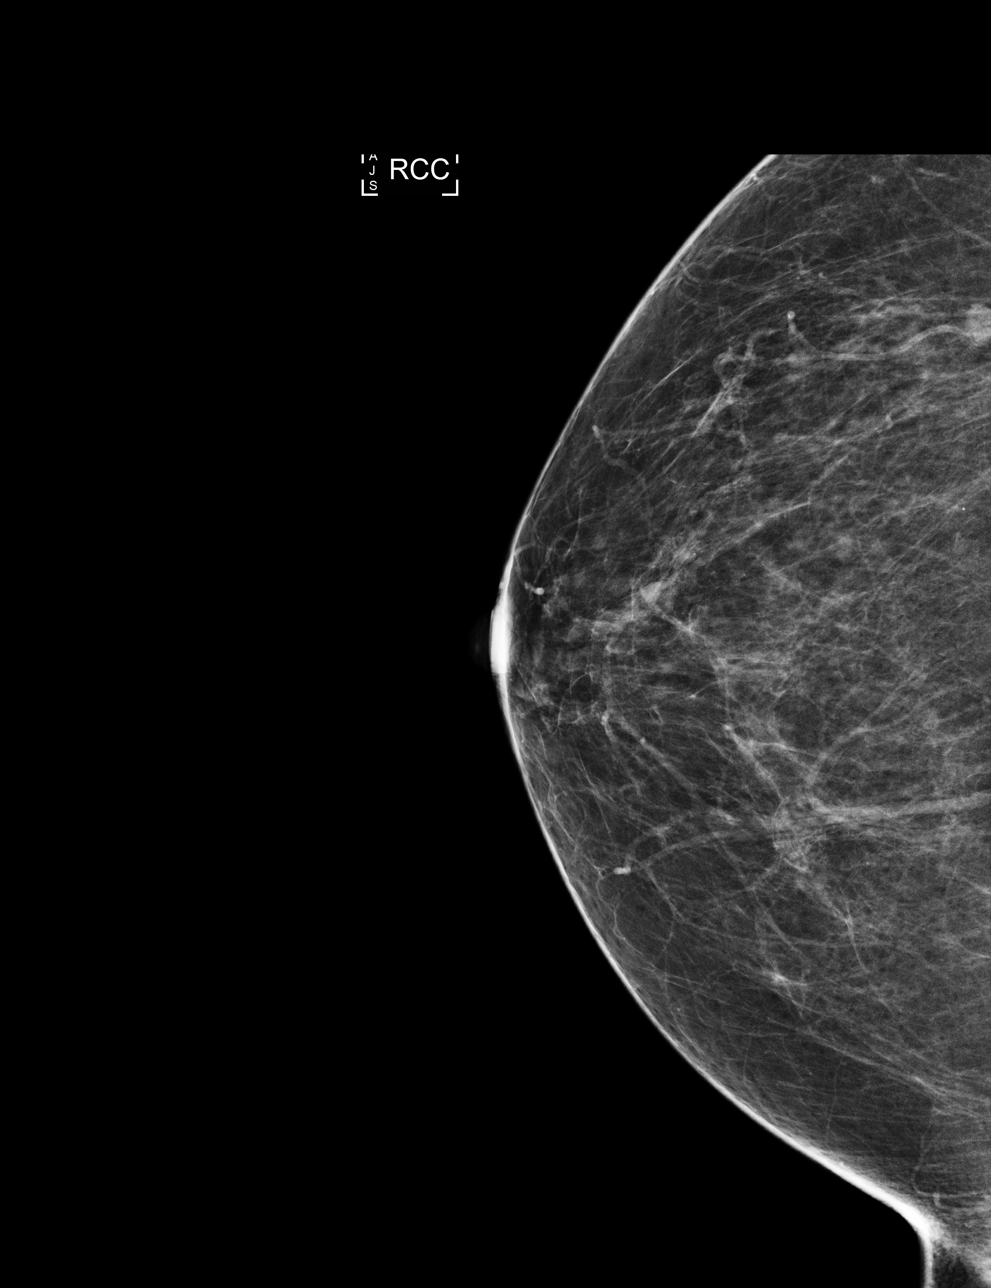

[R MLO]
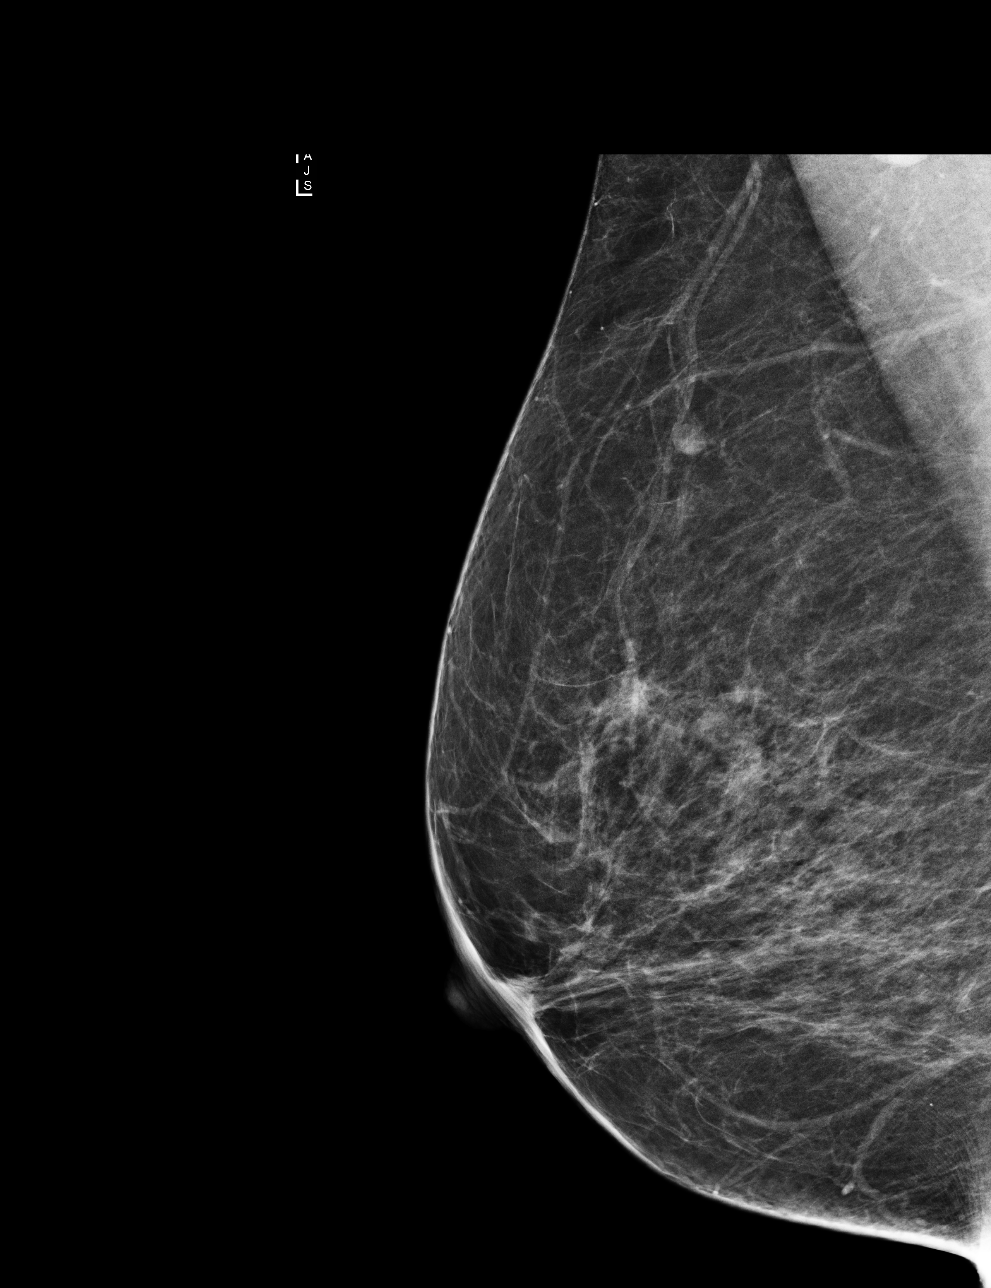

[L MLO]
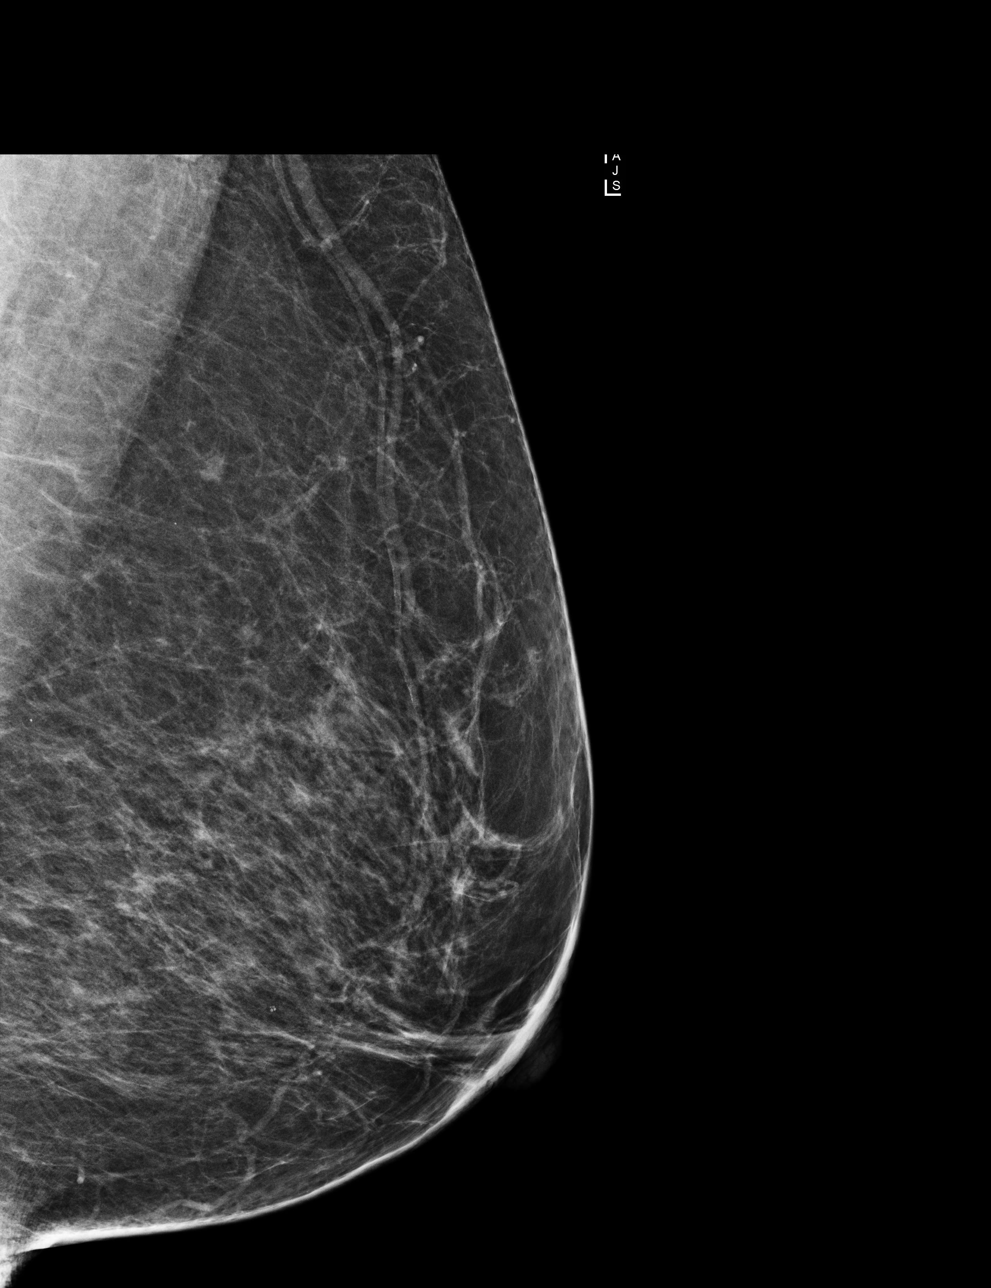

[4 of 4 positions shown; findings below may reference images not displayed]

ACR Breast Density Category b: There are scattered areas of
fibroglandular density.
FINDINGS: There are no findings suspicious for malignancy. Images were
processed with CAD.
IMPRESSION: No mammographic evidence of malignancy. A result letter of this
screening mammogram will be mailed directly to the patient.

RECOMMENDATION:
Screening mammogram in one year. (Code:AS-G-LCT)

BI-RADS CATEGORY  1: Negative.

## 2016-12-28 ENCOUNTER — Other Ambulatory Visit: Payer: Self-pay | Admitting: Family Medicine

## 2016-12-28 DIAGNOSIS — Z1231 Encounter for screening mammogram for malignant neoplasm of breast: Secondary | ICD-10-CM

## 2017-01-14 ENCOUNTER — Ambulatory Visit
Admission: RE | Admit: 2017-01-14 | Discharge: 2017-01-14 | Disposition: A | Discharge status: Home / Self Care | Payer: BLUE CROSS/BLUE SHIELD | Source: Ambulatory Visit | Attending: Family Medicine | Admitting: Family Medicine

## 2017-01-14 DIAGNOSIS — Z1231 Encounter for screening mammogram for malignant neoplasm of breast: Secondary | ICD-10-CM | POA: Insufficient documentation

## 2017-05-06 ENCOUNTER — Encounter: Payer: Self-pay | Admitting: *Deleted

## 2017-05-14 ENCOUNTER — Other Ambulatory Visit: Payer: Self-pay

## 2017-05-14 ENCOUNTER — Telehealth: Payer: Self-pay

## 2017-05-14 DIAGNOSIS — Z1211 Encounter for screening for malignant neoplasm of colon: Secondary | ICD-10-CM

## 2017-05-14 NOTE — Telephone Encounter (Signed)
Gastroenterology Pre-Procedure Review  Request Date: 05/27/17 Requesting Physician: Dr. Allegra LaiVanga  PATIENT REVIEW QUESTIONS: The patient responded to the following health history questions as indicated:    1. Are you having any GI issues? no 2. Do you have a personal history of Polyps? no 3. Do you have a family history of Colon Cancer or Polyps? no 4. Diabetes Mellitus? no 5. Joint replacements in the past 12 months?no 6. Major health problems in the past 3 months?no 7. Any artificial heart valves, MVP, or defibrillator?no    MEDICATIONS & ALLERGIES:    Patient reports the following regarding taking any anticoagulation/antiplatelet therapy:   Plavix, Coumadin, Eliquis, Xarelto, Lovenox, Pradaxa, Brilinta, or Effient? no Aspirin? yes (81 mg daily)  Patient confirms/reports the following medications:  Current Outpatient Medications  Medication Sig Dispense Refill  . aspirin 81 MG chewable tablet Chew 81 mg by mouth daily.    Marland Kitchen. ibuprofen (MOTRIN IB) 200 MG tablet Take 2 tablets (400 mg total) by mouth every 6 (six) hours as needed. 30 tablet 0  . lisinopril (PRINIVIL,ZESTRIL) 10 MG tablet Take 10 mg by mouth daily.     No current facility-administered medications for this visit.     Patient confirms/reports the following allergies:  No Known Allergies  No orders of the defined types were placed in this encounter.   AUTHORIZATION INFORMATION Primary Insurance: 1D#: Group #:  Secondary Insurance: 1D#: Group #:  SCHEDULE INFORMATION: Date: 05/27/17 Time: Location:[facility]

## 2017-05-27 ENCOUNTER — Encounter
Admission: RE | Disposition: A | Discharge status: Home / Self Care | Payer: Self-pay | Source: Ambulatory Visit | Attending: Gastroenterology

## 2017-05-27 ENCOUNTER — Ambulatory Visit: Payer: BLUE CROSS/BLUE SHIELD | Admitting: Anesthesiology

## 2017-05-27 ENCOUNTER — Other Ambulatory Visit: Payer: Self-pay

## 2017-05-27 ENCOUNTER — Encounter: Payer: Self-pay | Admitting: *Deleted

## 2017-05-27 ENCOUNTER — Ambulatory Visit
Admission: RE | Admit: 2017-05-27 | Discharge: 2017-05-27 | Disposition: A | Discharge status: Home / Self Care | Payer: BLUE CROSS/BLUE SHIELD | Source: Ambulatory Visit | Attending: Gastroenterology | Admitting: Gastroenterology

## 2017-05-27 DIAGNOSIS — Z1211 Encounter for screening for malignant neoplasm of colon: Secondary | ICD-10-CM | POA: Diagnosis not present

## 2017-05-27 DIAGNOSIS — Z79899 Other long term (current) drug therapy: Secondary | ICD-10-CM | POA: Insufficient documentation

## 2017-05-27 DIAGNOSIS — K644 Residual hemorrhoidal skin tags: Secondary | ICD-10-CM | POA: Diagnosis not present

## 2017-05-27 DIAGNOSIS — Z7982 Long term (current) use of aspirin: Secondary | ICD-10-CM | POA: Diagnosis not present

## 2017-05-27 DIAGNOSIS — I10 Essential (primary) hypertension: Secondary | ICD-10-CM | POA: Diagnosis not present

## 2017-05-27 HISTORY — PX: COLONOSCOPY WITH PROPOFOL: SHX5780

## 2017-05-27 SURGERY — COLONOSCOPY WITH PROPOFOL
Anesthesia: General

## 2017-05-27 MED ORDER — LIDOCAINE HCL (CARDIAC) 20 MG/ML IV SOLN
INTRAVENOUS | Status: DC | PRN
  Administered 2017-05-27: 40 mg via INTRAVENOUS

## 2017-05-27 MED ORDER — PROPOFOL 500 MG/50ML IV EMUL
INTRAVENOUS | Status: DC | PRN
  Administered 2017-05-27: 140 ug/kg/min via INTRAVENOUS

## 2017-05-27 MED ORDER — SODIUM CHLORIDE 0.9 % IV SOLN
INTRAVENOUS | Status: DC
  Administered 2017-05-27: 10:00:00 via INTRAVENOUS

## 2017-05-27 MED ORDER — PROPOFOL 10 MG/ML IV BOLUS
INTRAVENOUS | Status: DC | PRN
  Administered 2017-05-27: 70 mg via INTRAVENOUS
  Administered 2017-05-27: 20 mg via INTRAVENOUS

## 2017-05-27 NOTE — H&P (Signed)
  Arlyss Repressohini R Corrissa Martello, MD [address]  [address]  [place], [place] [address]  Main: [phone]  Fax: [phone] Pager: [phone]  Primary Care Physician:  Emogene MorganAycock, Ngwe A, MD Primary Gastroenterologist:  Dr. Arlyss Repressohini R Vinnie Gombert  Pre-Procedure History & Physical: HPI:  Debra Cervantes is a 57 y.o. female is here for an colonoscopy.   Past Medical History:  Diagnosis Date  . Hypertension     Past Surgical History:  Procedure Laterality Date  . CESAREAN SECTION    . TONSILLECTOMY      Prior to Admission medications   Medication Sig Start Date End Date Taking? Authorizing Provider  aspirin 81 MG chewable tablet Chew 81 mg by mouth daily.   Yes [provider]  lisinopril (PRINIVIL,ZESTRIL) 10 MG tablet Take 10 mg by mouth daily.   Yes [provider]    Allergies as of 05/14/2017  . (No Known Allergies)    Family History  Problem Relation Age of Onset  . Breast cancer Neg Hx     Social History   Socioeconomic History  . Marital status: Single    Spouse name: Not on file  . Number of children: Not on file  . Years of education: Not on file  . Highest education level: Not on file  Social Needs  . Financial resource strain: Not on file  . Food insecurity - worry: Not on file  . Food insecurity - inability: Not on file  . Transportation needs - medical: Not on file  . Transportation needs - non-medical: Not on file  Occupational History  . Not on file  Tobacco Use  . Smoking status: Never Smoker  . Smokeless tobacco: Never Used  Substance and Sexual Activity  . Alcohol use: No  . Drug use: No  . Sexual activity: Not on file  Other Topics Concern  . Not on file  Social History Narrative  . Not on file    Review of Systems: See HPI, otherwise negative ROS  Physical Exam: BP 129/88   Pulse 76   Temp 97.7 F (36.5 C) (Tympanic)   Resp 14   Ht 4\' 11"  (1.499 m)   Wt 170 lb (77.1 kg)   LMP 01/14/2012 (LMP Unknown)   SpO2 100%   BMI  34.34 kg/m  General:   Alert,  pleasant and cooperative in NAD Head:  Normocephalic and atraumatic. Neck:  Supple; no masses or thyromegaly. Lungs:  Clear throughout to auscultation.    Heart:  Regular rate and rhythm. Abdomen:  Soft, nontender and nondistended. Normal bowel sounds, without guarding, and without rebound.   Neurologic:  Alert and  oriented x4;  grossly normal neurologically.  Impression/Plan: Debra Cervantes is here for an colonoscopy to be performed for colon cancer screening   Risks, benefits, limitations, and alternatives regarding  colonoscopy have been reviewed with the patient.  Questions have been answered.  All parties agreeable.   Lannette Donathohini Kani Chauvin, MD  05/27/2017, 10:29 AM

## 2017-05-27 NOTE — Anesthesia Postprocedure Evaluation (Signed)
Anesthesia Post Note  Patient: Debra Cervantes  Procedure(s) Performed: COLONOSCOPY WITH PROPOFOL (N/A )  Patient location during evaluation: Endoscopy Anesthesia Type: General Level of consciousness: awake and alert and oriented Pain management: pain level controlled Vital Signs Assessment: post-procedure vital signs reviewed and stable Respiratory status: spontaneous breathing, nonlabored ventilation and respiratory function stable Cardiovascular status: blood pressure returned to baseline and stable Postop Assessment: no signs of nausea or vomiting Anesthetic complications: no     Last Vitals:  Vitals:   05/27/17 1213 05/27/17 1223  BP: (!) 144/63 (!) 127/96  Pulse: 75 70  Resp: 16 16  Temp:    SpO2: 100% 100%    Last Pain:  Vitals:   05/27/17 0950  TempSrc: Tympanic                 [name]

## 2017-05-27 NOTE — Anesthesia Post-op Follow-up Note (Signed)
Anesthesia QCDR form completed.        

## 2017-05-27 NOTE — Transfer of Care (Signed)
Immediate Anesthesia Transfer of Care Note  Patient: Debra Cervantes  Procedure(s) Performed: COLONOSCOPY WITH PROPOFOL (N/A )  Patient Location: PACU  Anesthesia Type:General  Level of Consciousness: sedated  Airway & Oxygen Therapy: Patient Spontanous Breathing and Patient connected to nasal cannula oxygen  Post-op Assessment: Report given to RN and Post -op Vital signs reviewed and stable  Post vital signs: Reviewed and stable  Last Vitals:  Vitals:   05/27/17 0950 05/27/17 1203  BP: 129/88 125/77  Pulse: 76 87  Resp: 14 16  Temp: 36.5 C 36.9 C  SpO2: 100% 97%    Last Pain:  Vitals:   05/27/17 0950  TempSrc: Tympanic         Complications: No apparent anesthesia complications

## 2017-05-27 NOTE — Anesthesia Preprocedure Evaluation (Signed)
Anesthesia Evaluation  Patient identified by MRN, date of birth, ID band Patient awake    Reviewed: Allergy & Precautions, NPO status , Patient's Chart, lab work & pertinent test results  History of Anesthesia Complications Negative for: history of anesthetic complications  Airway Mallampati: II  TM Distance: >3 FB Neck ROM: Full    Dental  (+) Edentulous Upper, Loose, Missing   Pulmonary neg pulmonary ROS, neg sleep apnea, neg COPD,    breath sounds clear to auscultation- rhonchi (-) wheezing      Cardiovascular Exercise Tolerance: Good hypertension, Pt. on medications (-) CAD, (-) Past MI, (-) Cardiac Stents and (-) CABG  Rhythm:Regular Rate:Normal - Systolic murmurs and - Diastolic murmurs    Neuro/Psych negative neurological ROS  negative psych ROS   GI/Hepatic negative GI ROS, Neg liver ROS,   Endo/Other  negative endocrine ROSneg diabetes  Renal/GU negative Renal ROS     Musculoskeletal negative musculoskeletal ROS (+)   Abdominal (+) + obese,   Peds  Hematology negative hematology ROS (+)   Anesthesia Other Findings Past Medical History: No date: Hypertension   Reproductive/Obstetrics                             Anesthesia Physical Anesthesia Plan  ASA: II  Anesthesia Plan: General   Post-op Pain Management:    Induction: Intravenous  PONV Risk Score and Plan: 2 and Propofol infusion  Airway Management Planned: Natural Airway  Additional Equipment:   Intra-op Plan:   Post-operative Plan:   Informed Consent: I have reviewed the patients History and Physical, chart, labs and discussed the procedure including the risks, benefits and alternatives for the proposed anesthesia with the patient or authorized representative who has indicated his/her understanding and acceptance.   Dental advisory given  Plan Discussed with: CRNA and Anesthesiologist  Anesthesia Plan  Comments:         Anesthesia Quick Evaluation

## 2017-05-27 NOTE — Op Note (Signed)
[facility] Patient Name: Debra Cervantes Procedure Date: 05/27/2017 11:40 AM MRN: [id number] Account #: [id number] Date of Birth: [birth year] Admit Type: Outpatient Age: 57 Room: [facility] ENDO ROOM 2 Gender: Female Note Status: Finalized Procedure:            Colonoscopy Indications:          Screening for colorectal malignant neoplasm, Last                        colonoscopy: February 2003 Providers:            Lin Landsman MD, MD Referring MD:         Fonnie Mu A. Aycock MD (Referring MD) Medicines:            Monitored Anesthesia Care Complications:        No immediate complications. Estimated blood loss: None. Procedure:            Pre-Anesthesia Assessment:                       - Prior to the procedure, a History and Physical was                        performed, and patient medications and allergies were                        reviewed. The patient is competent. The risks and                        benefits of the procedure and the sedation options and                        risks were discussed with the patient. All questions                        were answered and informed consent was obtained.                        Patient identification and proposed procedure were                        verified by the physician, the nurse, the                        anesthesiologist, the anesthetist and the technician in                        the pre-procedure area in the procedure room in the                        endoscopy suite. Mental Status Examination: alert and                        oriented. Airway Examination: normal oropharyngeal                        airway and neck mobility. Respiratory Examination:                        clear to auscultation. CV Examination: normal.  Prophylactic Antibiotics: The patient does not require                        prophylactic antibiotics. Prior Anticoagulants: The   patient has taken no previous anticoagulant or                        antiplatelet agents. ASA Grade Assessment: II - A                        patient with mild systemic disease. After reviewing the                        risks and benefits, the patient was deemed in                        satisfactory condition to undergo the procedure. The                        anesthesia plan was to use monitored anesthesia care                        (MAC). Immediately prior to administration of                        medications, the patient was re-assessed for adequacy                        to receive sedatives. The heart rate, respiratory rate,                        oxygen saturations, blood pressure, adequacy of                        pulmonary ventilation, and response to care were                        monitored throughout the procedure. The physical status                        of the patient was re-assessed after the procedure.                       After obtaining informed consent, the colonoscope was                        passed under direct vision. Throughout the procedure,                        the patient's blood pressure, pulse, and oxygen                        saturations were monitored continuously. The                        Colonoscope was introduced through the anus and                        advanced to the the cecum, identified by appendiceal  orifice and ileocecal valve. The colonoscopy was                        performed without difficulty. The patient tolerated the                        procedure well. The quality of the bowel preparation                        was evaluated using the BBPS [facility] Bowel Preparation                        Scale) with scores of: Right Colon = 3, Transverse                        Colon = 3 and Left Colon = 3 (entire mucosa seen well                        with no residual staining, small fragments of stool or                         opaque liquid). The total BBPS score equals 9. Findings:      The perianal exam findings include skin tags.      The colon (entire examined portion) appeared normal.      The retroflexed view of the distal rectum and anal verge was normal and       showed no anal or rectal abnormalities. Impression:           - Perianal skin tags found on perianal exam.                       - The entire examined colon is normal.                       - The distal rectum and anal verge are normal on                        retroflexion view.                       - No specimens collected. Recommendation:       - Discharge patient to home.                       - Resume previous diet today.                       - Continue present medications.                       - Repeat colonoscopy in 10 years for surveillance. Procedure Code(s):    --- Professional ---                       Z6109, Colorectal cancer screening; colonoscopy on                        individual not meeting criteria for high risk Diagnosis Code(s):    --- Professional ---  Z12.11, Encounter for screening for malignant neoplasm                        of colon                       K64.4, Residual hemorrhoidal skin tags CPT copyright 2016 American Medical Association. All rights reserved. The codes documented in this report are preliminary and upon coder review may  be revised to meet current compliance requirements. Dr. Ulyess Mort Lin Landsman MD, MD 05/27/2017 12:00:55 PM This report has been signed electronically. Number of Addenda: 0 Note Initiated On: 05/27/2017 11:40 AM Scope Withdrawal Time: 0 hours 5 minutes 13 seconds  Total Procedure Duration: 0 hours 7 minutes 57 seconds       [facility]

## 2017-05-28 ENCOUNTER — Encounter: Payer: Self-pay | Admitting: Gastroenterology

## 2017-12-03 IMAGING — MG 2D DIGITAL SCREENING BILATERAL MAMMOGRAM WITH CAD AND ADJUNCT TO
8 of 14 series · 8 of 30 positions shown · non-contrast
Comparison: Previous exam(s).

CLINICAL DATA: Screening.

EXAM:
2D DIGITAL SCREENING BILATERAL MAMMOGRAM WITH CAD AND ADJUNCT TOMO

[R MLO (1 of 2)]
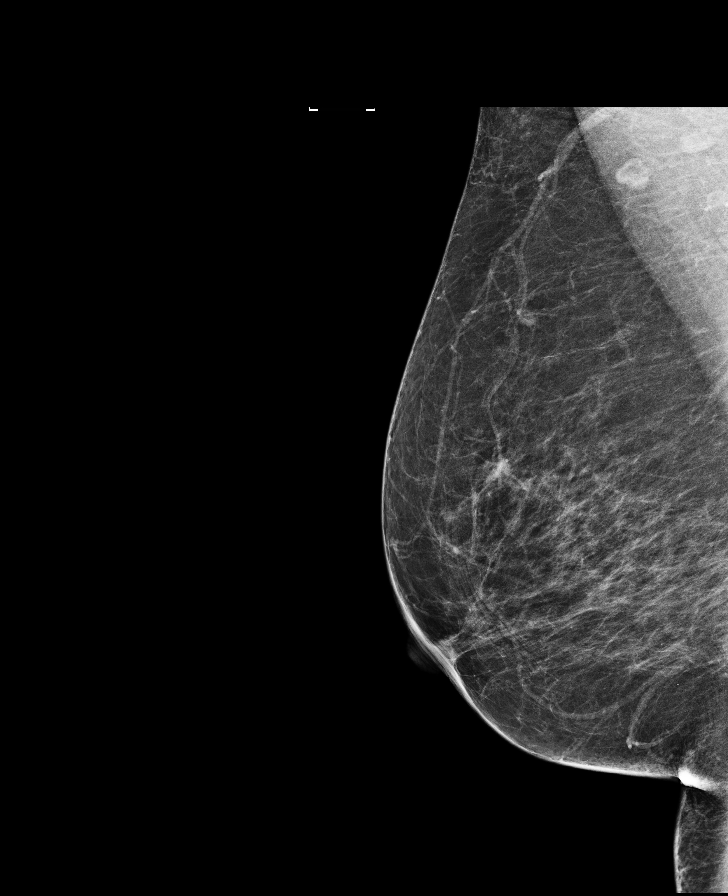

[L MLO (1 of 2)]
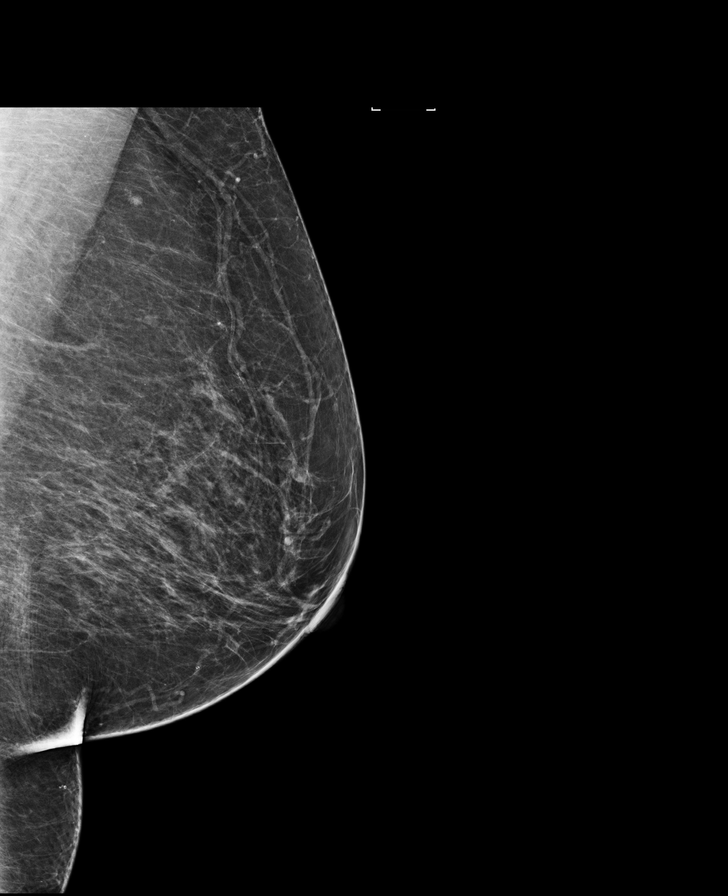

[L CC synth-2D]
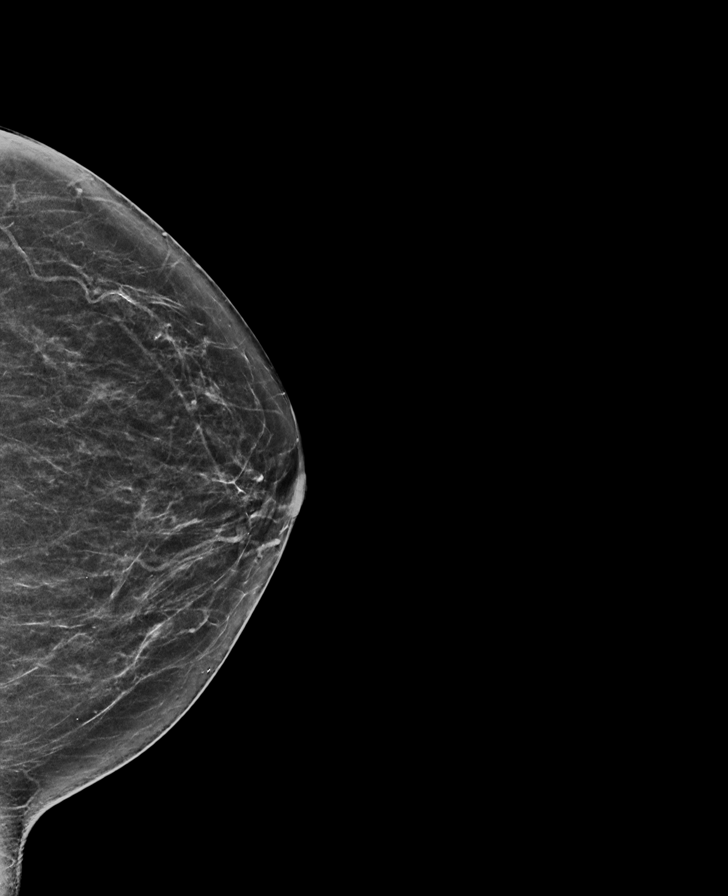

[R MLO (2 of 2)]
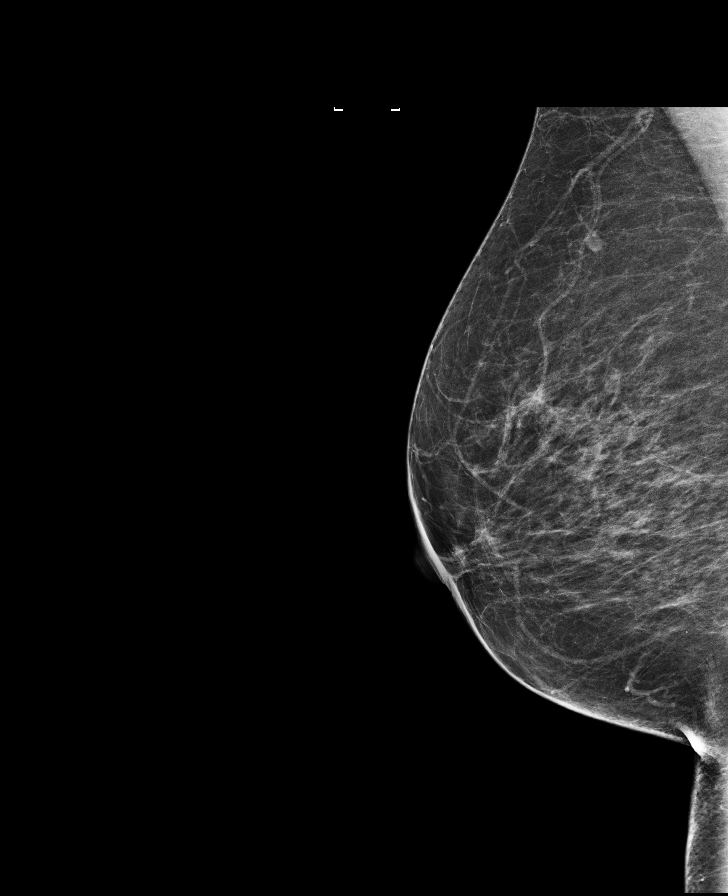

[L MLO synth-2D]
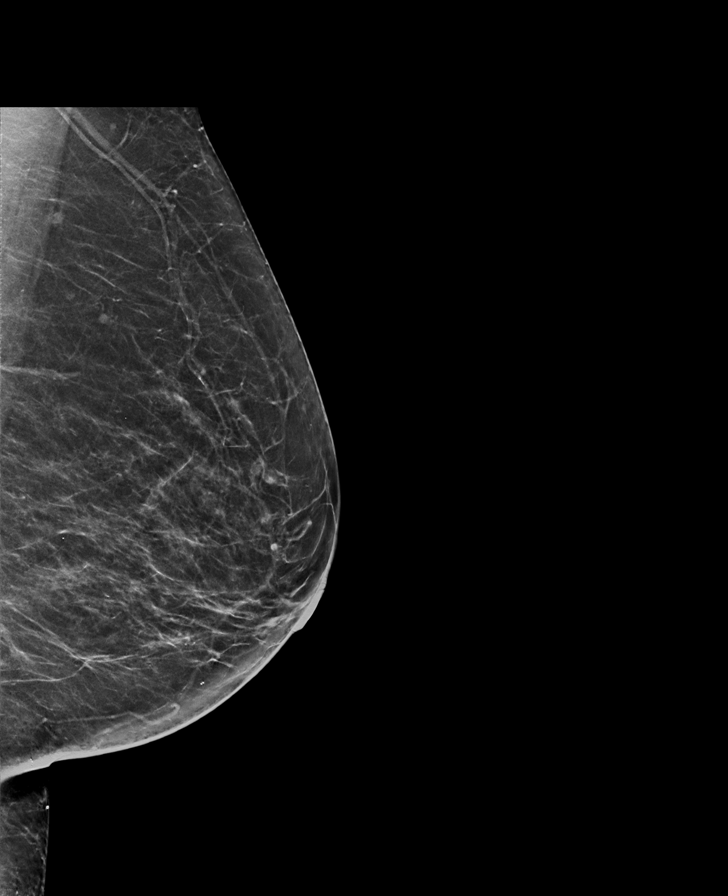

[R MLO synth-2D]
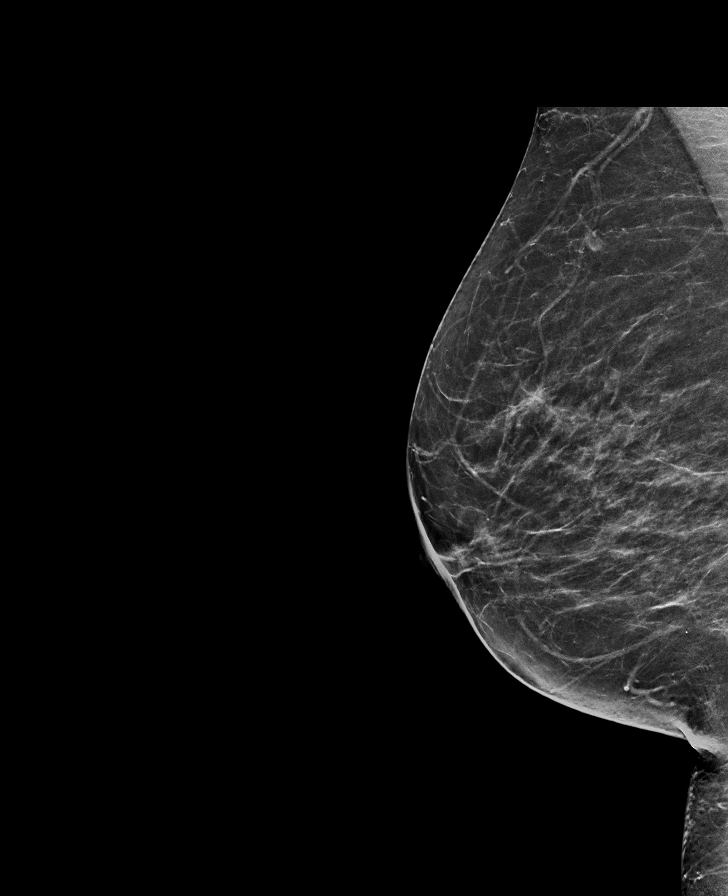

[L MLO (2 of 2)]
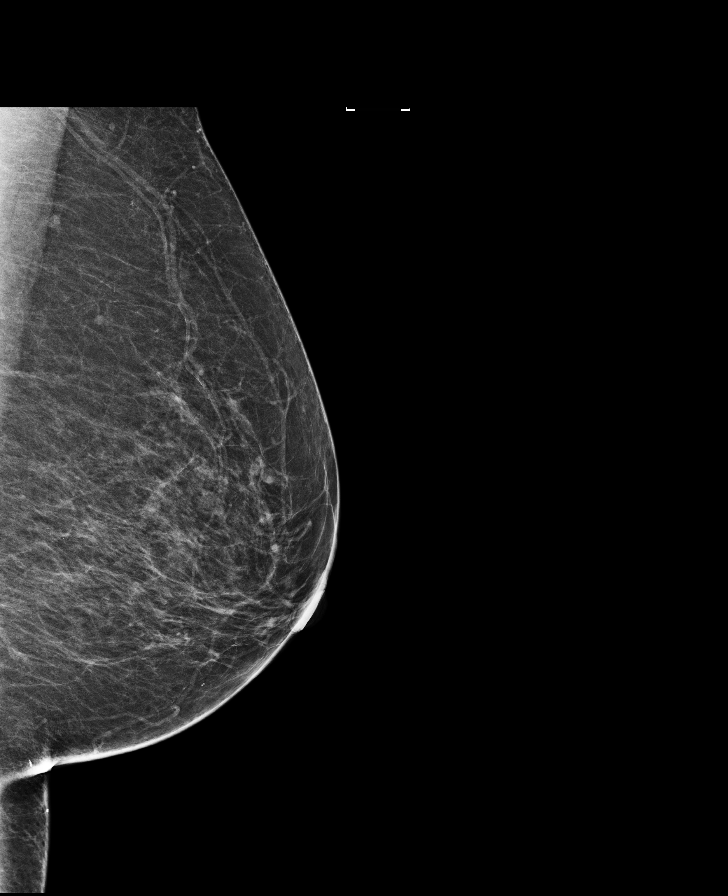

[R CC synth-2D]
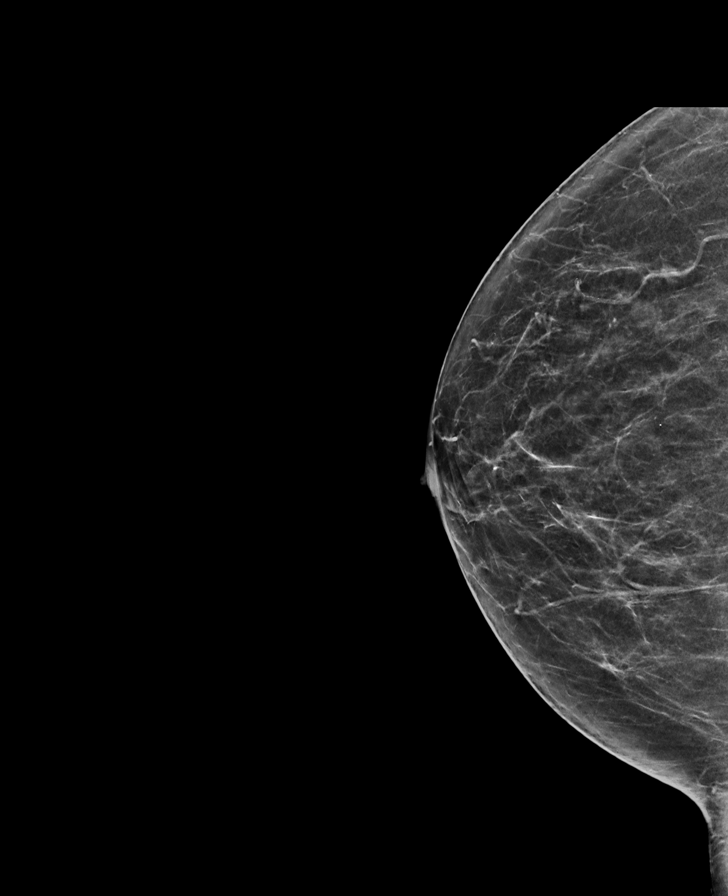

[8 of 30 positions shown; findings below may reference images not displayed]

ACR Breast Density Category b: There are scattered areas of
fibroglandular density.
FINDINGS: There are no findings suspicious for malignancy. Images were
processed with CAD.
IMPRESSION: No mammographic evidence of malignancy. A result letter of this
screening mammogram will be mailed directly to the patient.

RECOMMENDATION:
Screening mammogram in one year. (Code:97-6-RS4)

BI-RADS CATEGORY  1: Negative.

## 2018-01-04 ENCOUNTER — Other Ambulatory Visit: Payer: Self-pay | Admitting: Family Medicine

## 2018-01-04 DIAGNOSIS — Z1231 Encounter for screening mammogram for malignant neoplasm of breast: Secondary | ICD-10-CM

## 2018-01-26 ENCOUNTER — Ambulatory Visit
Admission: RE | Admit: 2018-01-26 | Discharge: 2018-01-26 | Disposition: A | Discharge status: Home / Self Care | Payer: BLUE CROSS/BLUE SHIELD | Source: Ambulatory Visit | Attending: Family Medicine | Admitting: Family Medicine

## 2018-01-26 DIAGNOSIS — Z1231 Encounter for screening mammogram for malignant neoplasm of breast: Secondary | ICD-10-CM | POA: Insufficient documentation

## 2018-12-12 IMAGING — MG 2D DIGITAL SCREENING BILATERAL MAMMOGRAM WITH CAD AND ADJUNCT TO
8 of 12 series · 8 of 28 positions shown · non-contrast
Comparison: Previous exam(s).

CLINICAL DATA: Screening.

EXAM:
2D DIGITAL SCREENING BILATERAL MAMMOGRAM WITH CAD AND ADJUNCT TOMO

[R MLO]
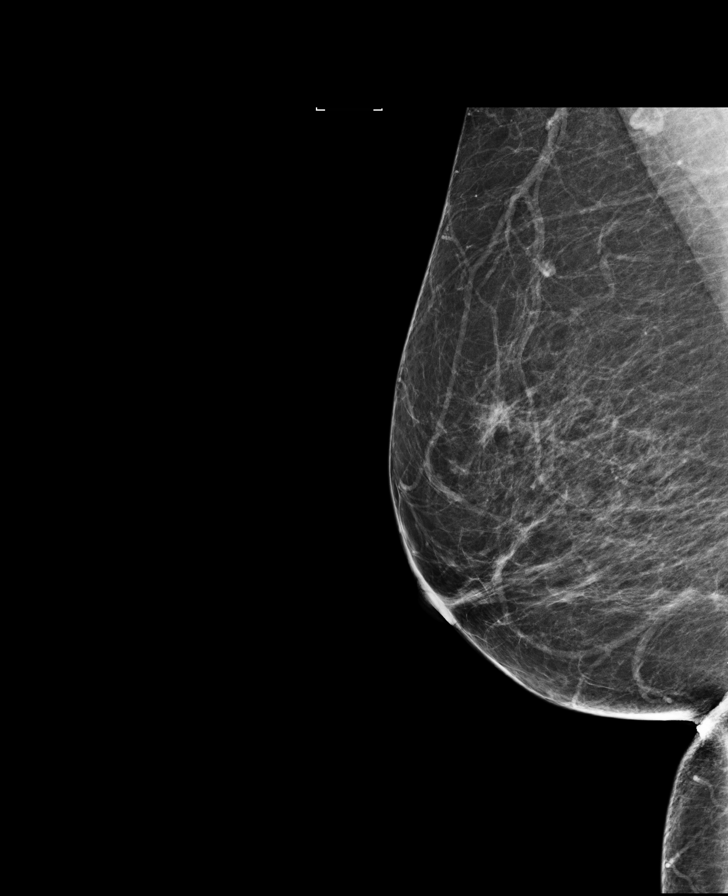

[L CC synth-2D]
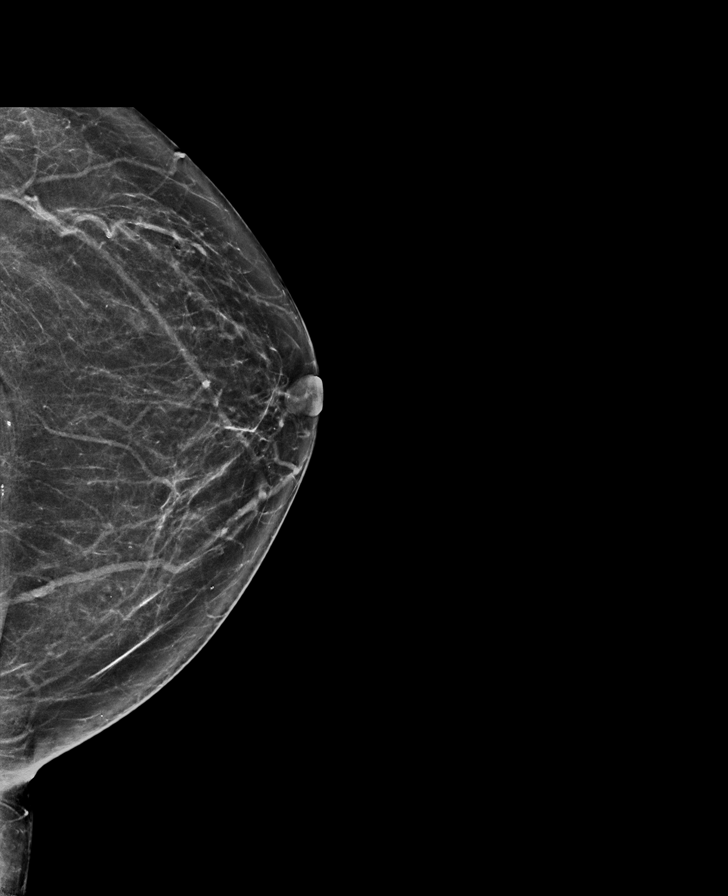

[L MLO]
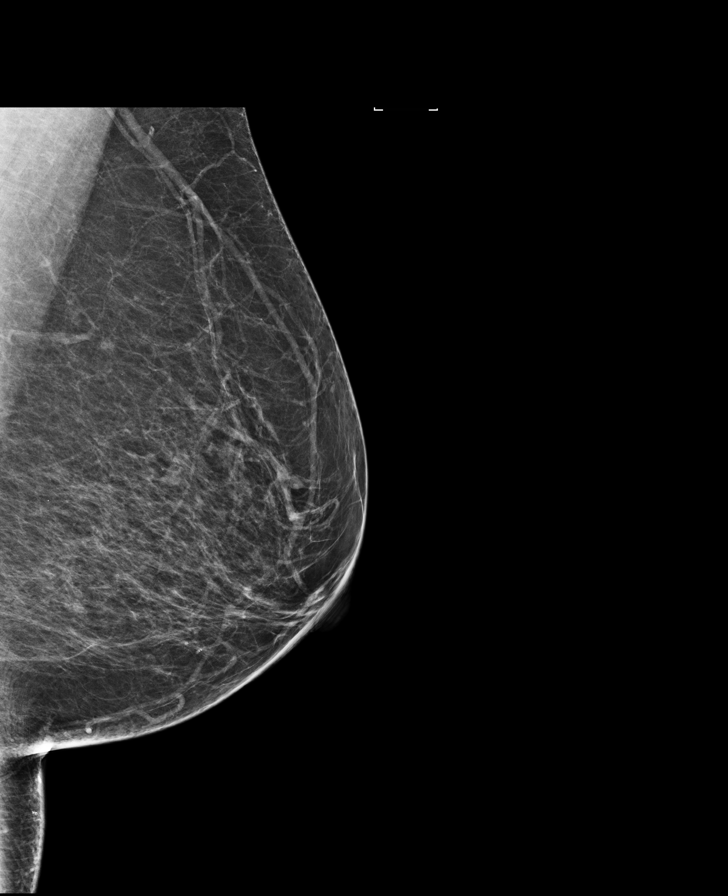

[R CC]
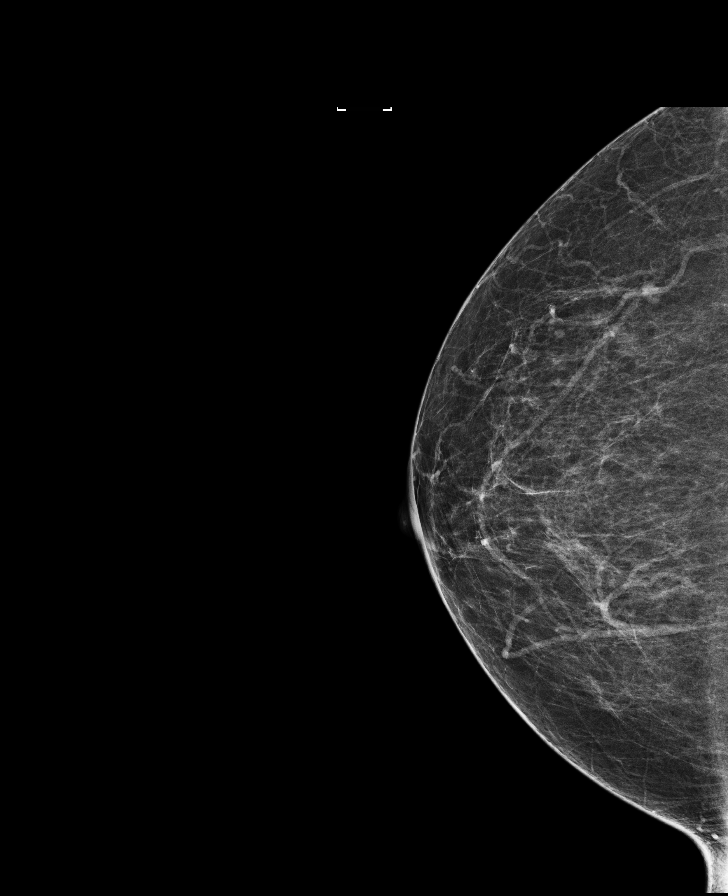

[R MLO synth-2D]
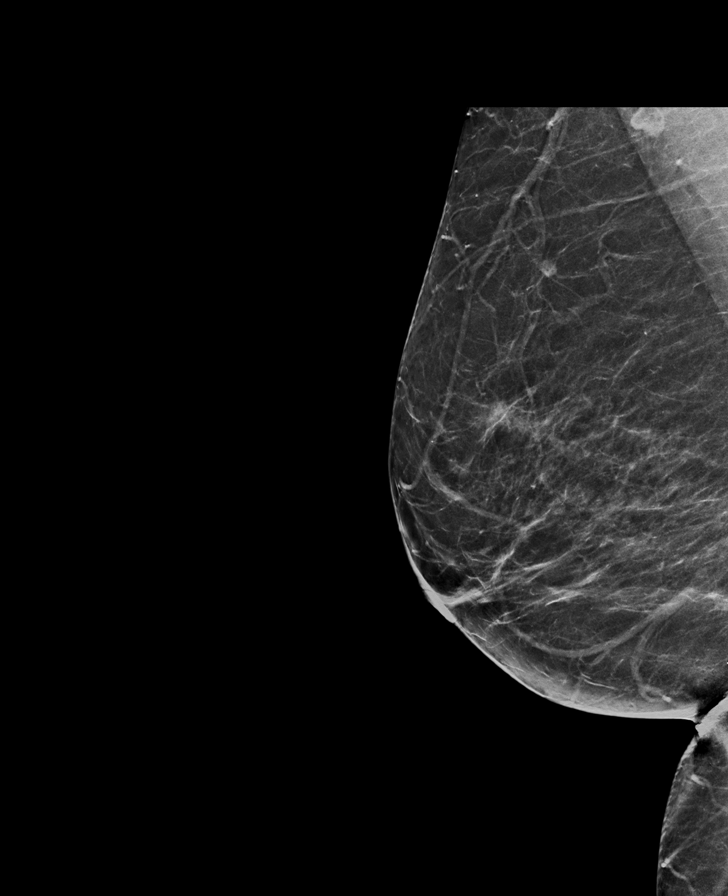

[R CC synth-2D]
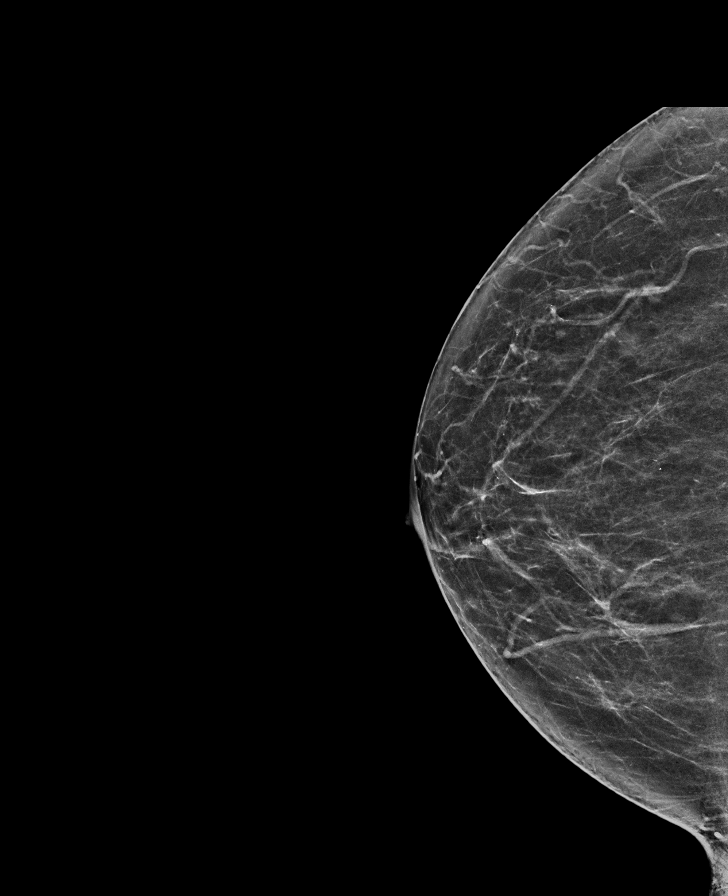

[L CC]
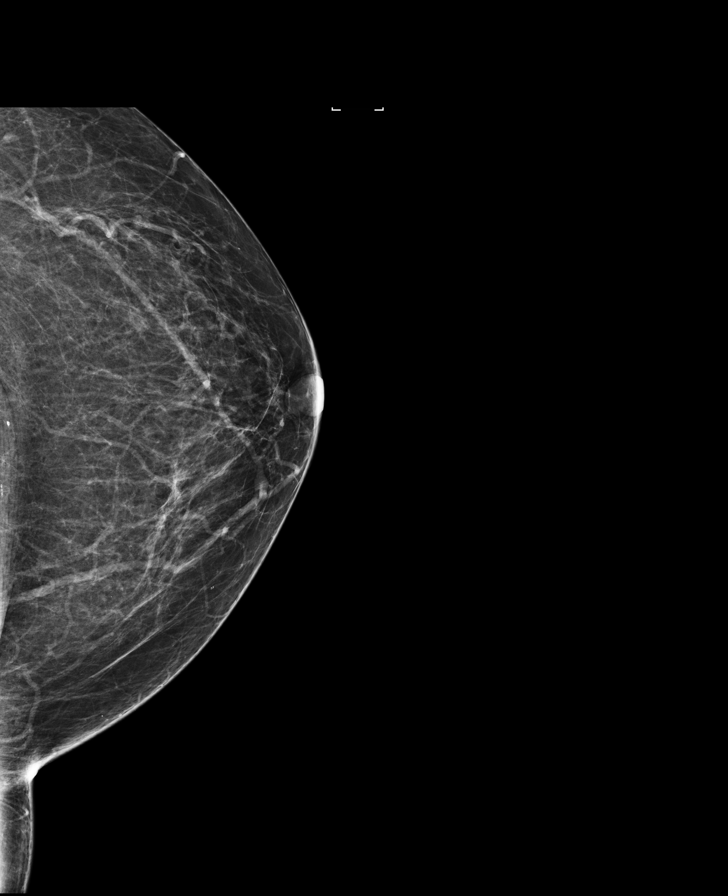

[L MLO synth-2D]
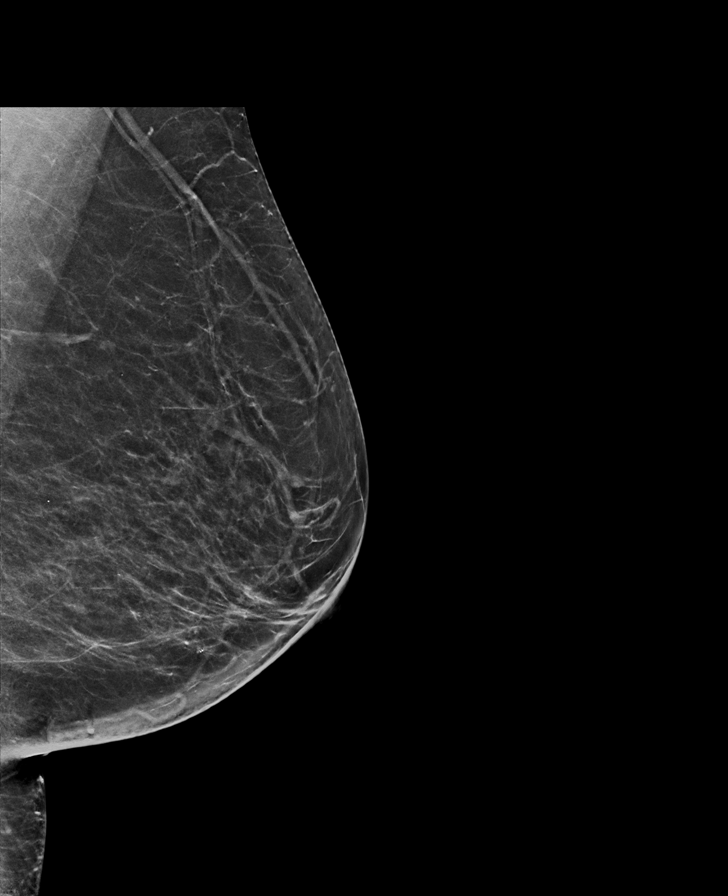

[8 of 28 positions shown; findings below may reference images not displayed]

ACR Breast Density Category b: There are scattered areas of
fibroglandular density.
FINDINGS: There are no findings suspicious for malignancy. Images were
processed with CAD.
IMPRESSION: No mammographic evidence of malignancy. A result letter of this
screening mammogram will be mailed directly to the patient.

RECOMMENDATION:
Screening mammogram in one year. (Code:97-6-RS4)

BI-RADS CATEGORY  1: Negative.

## 2018-12-27 ENCOUNTER — Other Ambulatory Visit: Payer: Self-pay | Admitting: Family Medicine

## 2019-01-09 ENCOUNTER — Other Ambulatory Visit: Payer: Self-pay | Admitting: Family Medicine

## 2019-01-09 DIAGNOSIS — Z1231 Encounter for screening mammogram for malignant neoplasm of breast: Secondary | ICD-10-CM

## 2019-02-20 ENCOUNTER — Ambulatory Visit
Admission: RE | Admit: 2019-02-20 | Discharge: 2019-02-20 | Disposition: A | Discharge status: Home / Self Care | Payer: BC Managed Care – PPO | Source: Ambulatory Visit | Attending: Family Medicine | Admitting: Family Medicine

## 2019-02-20 DIAGNOSIS — Z1231 Encounter for screening mammogram for malignant neoplasm of breast: Secondary | ICD-10-CM | POA: Diagnosis present

## 2019-06-20 ENCOUNTER — Other Ambulatory Visit: Payer: Self-pay

## 2019-06-20 ENCOUNTER — Ambulatory Visit
Admission: RE | Admit: 2019-06-20 | Discharge: 2019-06-20 | Disposition: A | Discharge status: Home / Self Care | Payer: BC Managed Care – PPO | Source: Ambulatory Visit | Attending: Family Medicine | Admitting: Family Medicine

## 2019-06-20 ENCOUNTER — Other Ambulatory Visit: Payer: Self-pay | Admitting: Family Medicine

## 2019-06-20 DIAGNOSIS — R600 Localized edema: Secondary | ICD-10-CM

## 2019-11-24 ENCOUNTER — Encounter (INDEPENDENT_AMBULATORY_CARE_PROVIDER_SITE_OTHER): Payer: Self-pay | Admitting: Ophthalmology

## 2019-11-24 ENCOUNTER — Other Ambulatory Visit: Payer: Self-pay

## 2019-11-24 ENCOUNTER — Encounter (INDEPENDENT_AMBULATORY_CARE_PROVIDER_SITE_OTHER): Payer: BLUE CROSS/BLUE SHIELD | Admitting: Ophthalmology

## 2019-12-24 IMAGING — MG DIGITAL SCREENING BILATERAL MAMMOGRAM WITH TOMO AND CAD
8 series · 8 of 24 positions shown · non-contrast
Comparison: Previous exam(s).

CLINICAL DATA: Screening.

EXAM:
DIGITAL SCREENING BILATERAL MAMMOGRAM WITH TOMO AND CAD

[R CC synth-2D]
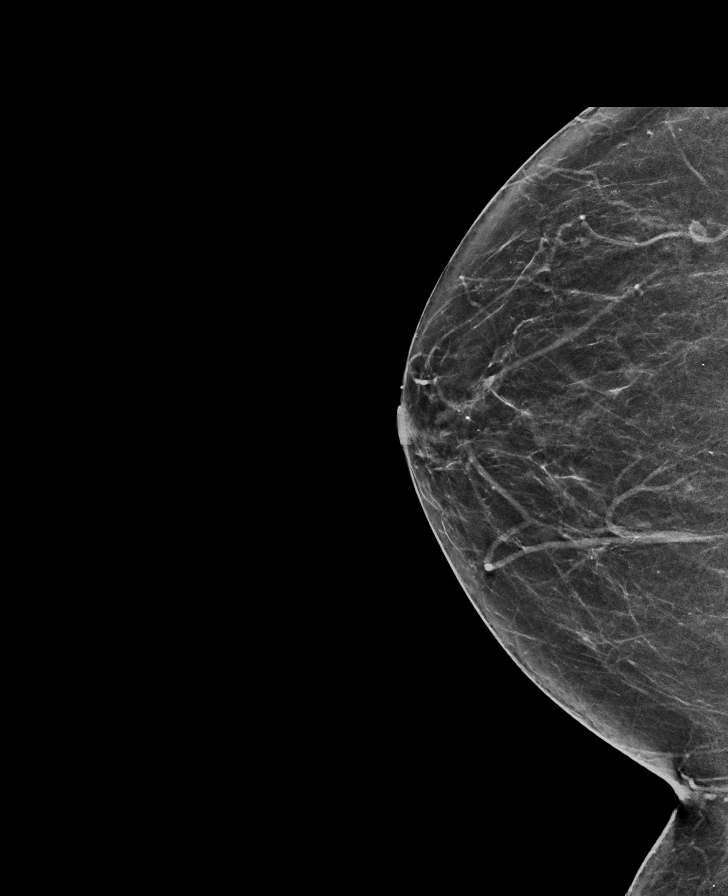

[L CC synth-2D]
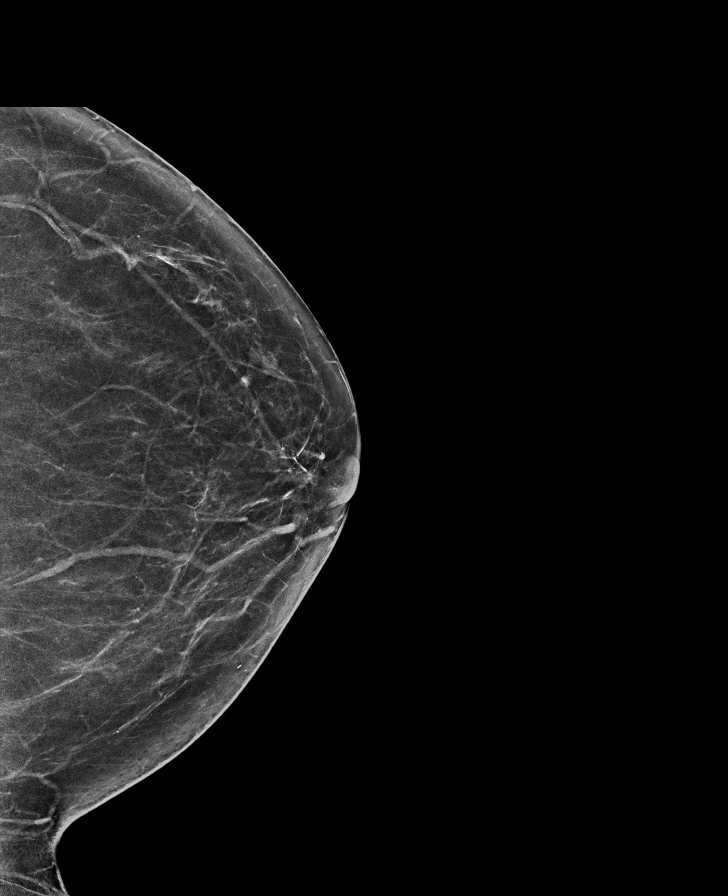

[R MLO synth-2D]
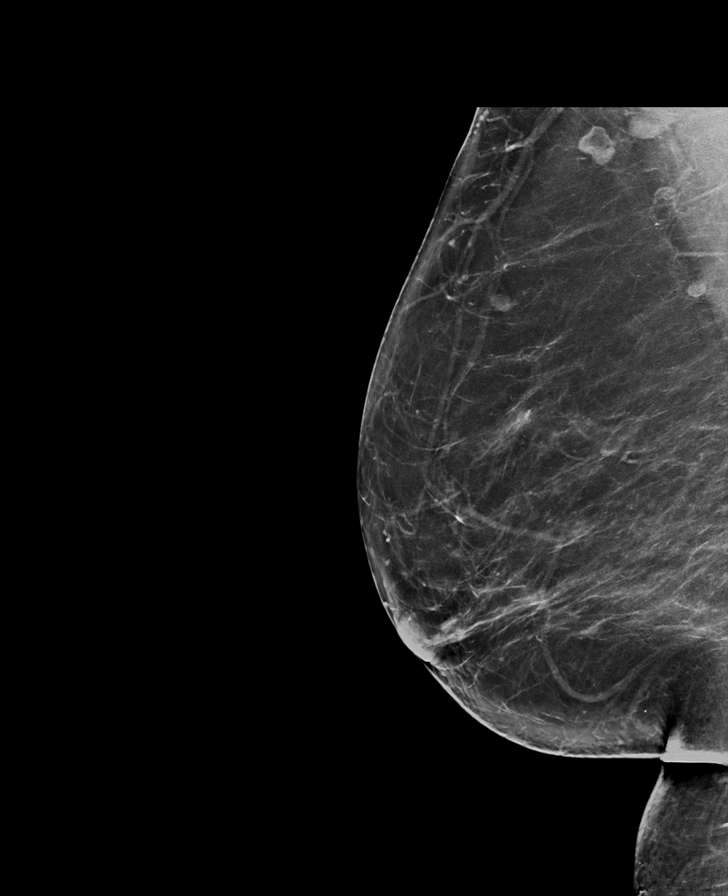

[L MLO synth-2D]
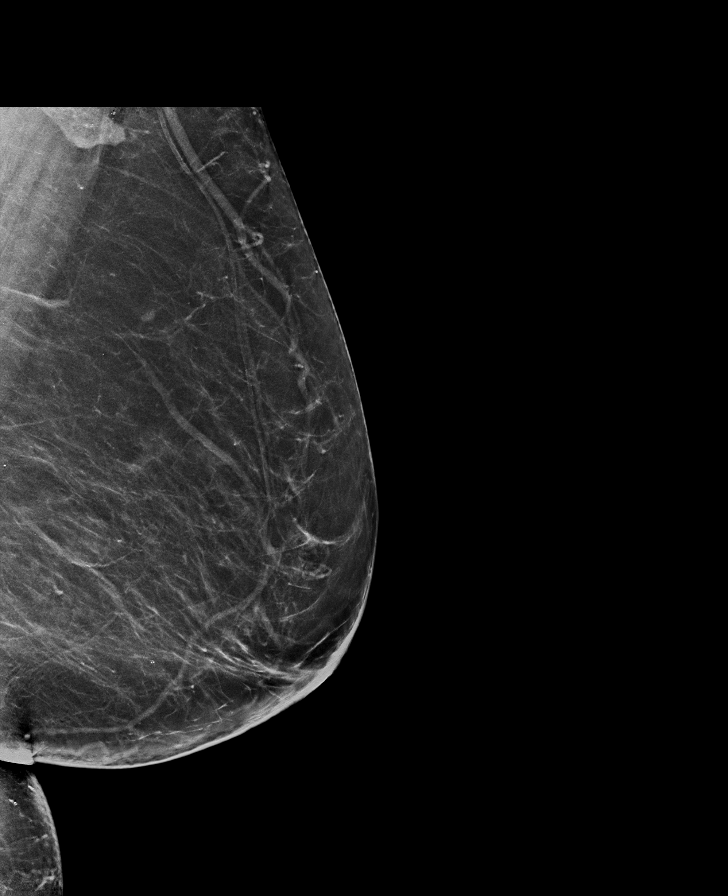

[L MLO tomo · tomo slice 38/75.0]
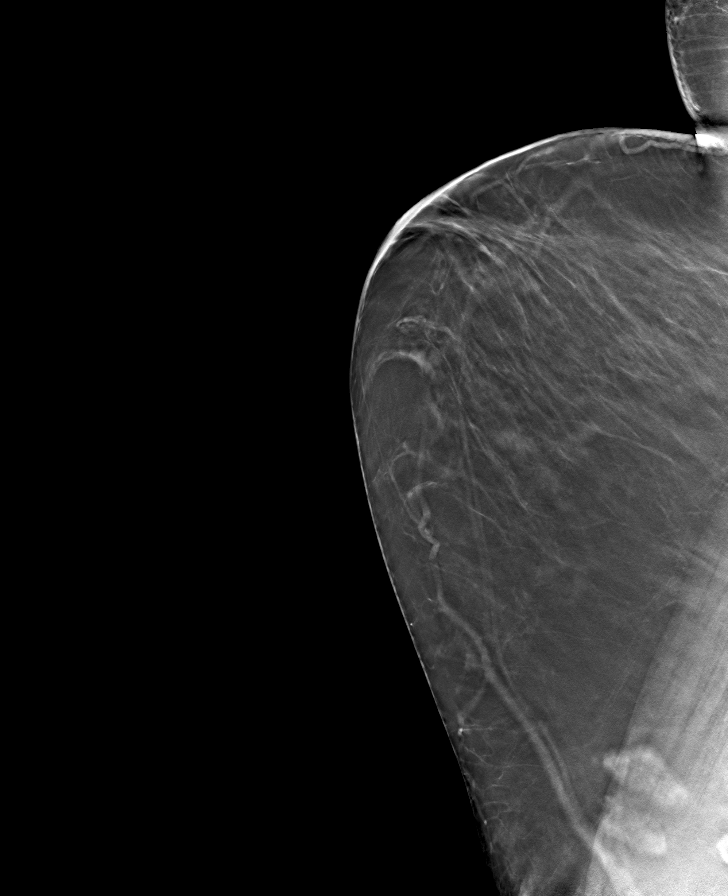

[R CC tomo · tomo slice 33/64.0]
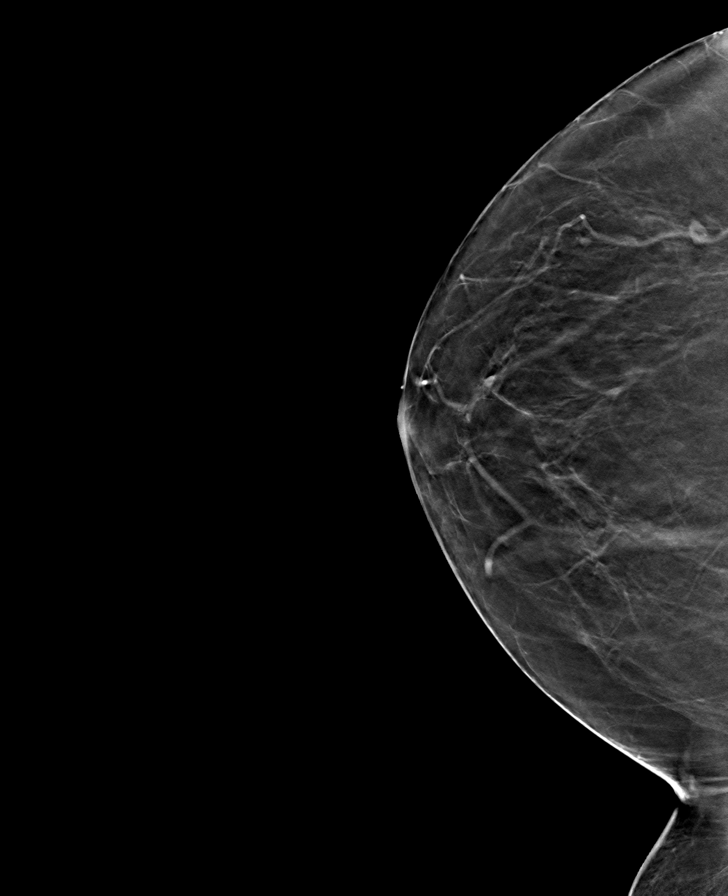

[L CC tomo · tomo slice 34/67.0]
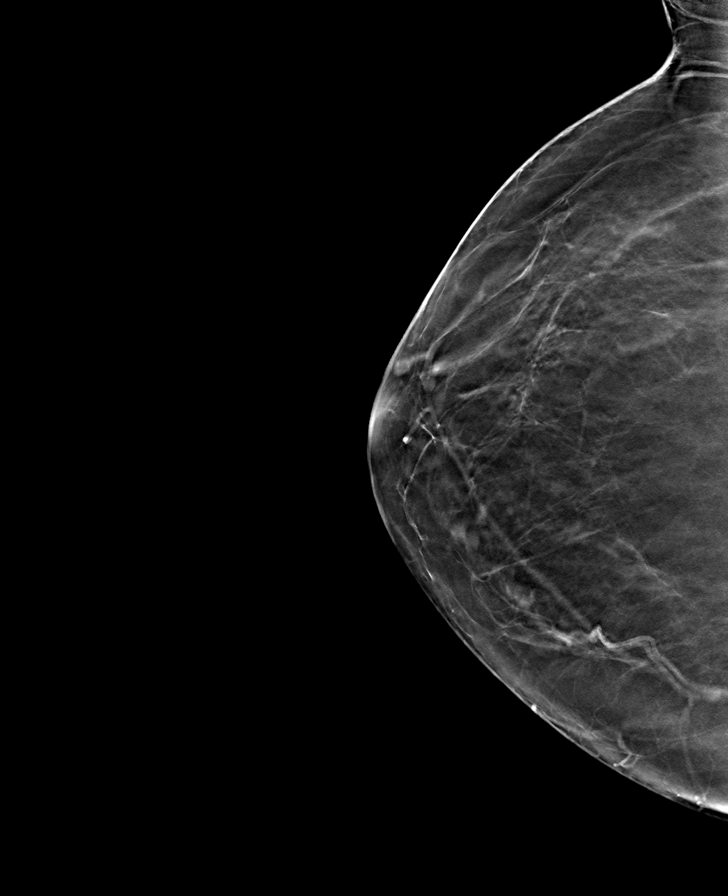

[R MLO tomo · tomo slice 42/83.0]
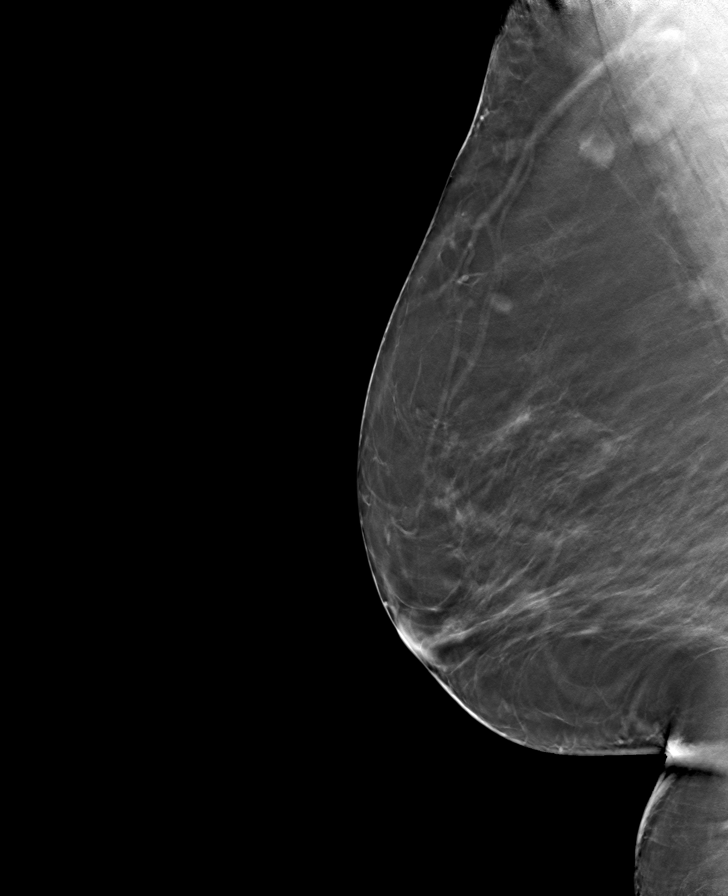

[8 of 24 positions shown; findings below may reference images not displayed]

ACR Breast Density Category b: There are scattered areas of
fibroglandular density.
FINDINGS: There are no findings suspicious for malignancy. Images were
processed with CAD.
IMPRESSION: No mammographic evidence of malignancy. A result letter of this
screening mammogram will be mailed directly to the patient.

RECOMMENDATION:
Screening mammogram in one year. (Code:CN-U-775)

BI-RADS CATEGORY  1: Negative.

## 2020-01-26 ENCOUNTER — Other Ambulatory Visit: Payer: Self-pay | Admitting: Family Medicine

## 2020-01-26 DIAGNOSIS — Z1231 Encounter for screening mammogram for malignant neoplasm of breast: Secondary | ICD-10-CM

## 2020-03-06 ENCOUNTER — Other Ambulatory Visit: Payer: Self-pay

## 2020-03-06 ENCOUNTER — Ambulatory Visit
Admission: RE | Admit: 2020-03-06 | Discharge: 2020-03-06 | Disposition: A | Discharge status: Home / Self Care | Payer: BLUE CROSS/BLUE SHIELD | Source: Ambulatory Visit | Attending: Family Medicine | Admitting: Family Medicine

## 2020-03-06 DIAGNOSIS — Z1231 Encounter for screening mammogram for malignant neoplasm of breast: Secondary | ICD-10-CM | POA: Insufficient documentation

## 2021-01-17 IMAGING — MG DIGITAL SCREENING BILAT W/ TOMO W/ CAD
8 series · 8 of 24 positions shown · non-contrast
Comparison: Previous exam(s).

CLINICAL DATA: Screening.

EXAM:
DIGITAL SCREENING BILATERAL MAMMOGRAM WITH TOMO AND CAD

[L CC synth-2D]
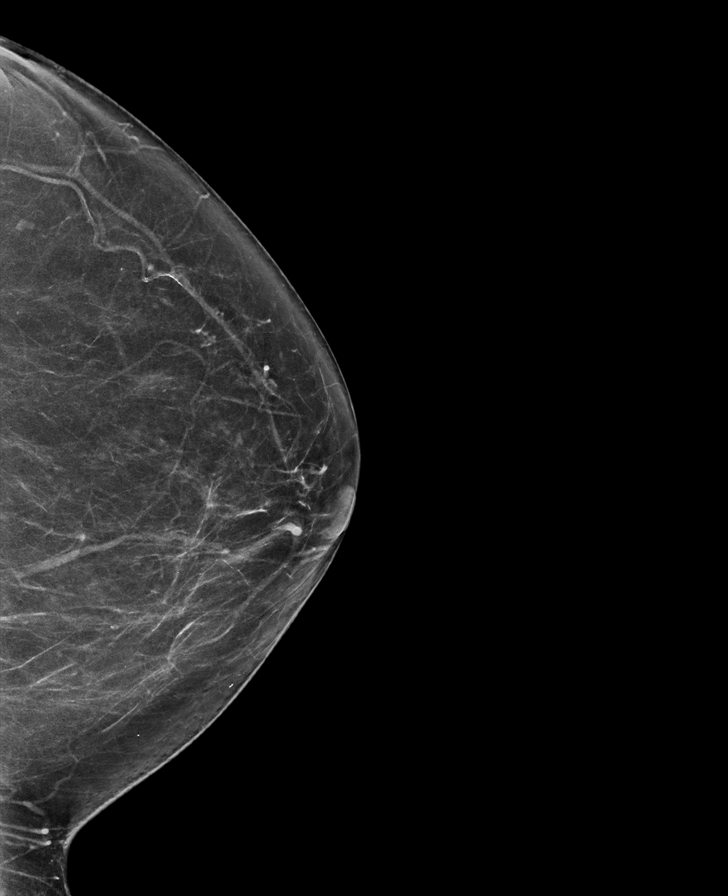

[R CC synth-2D]
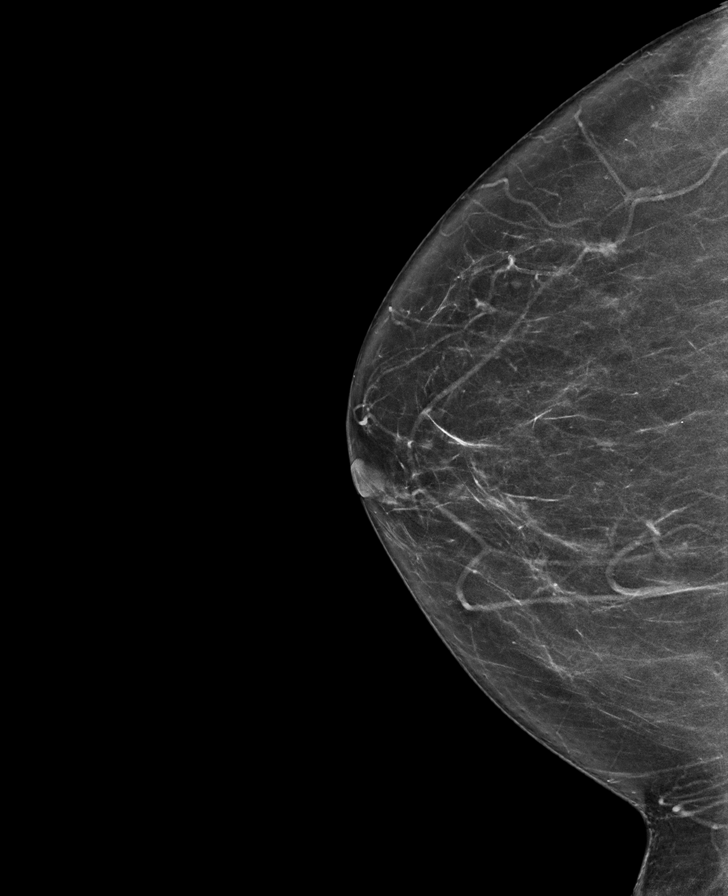

[L MLO synth-2D]
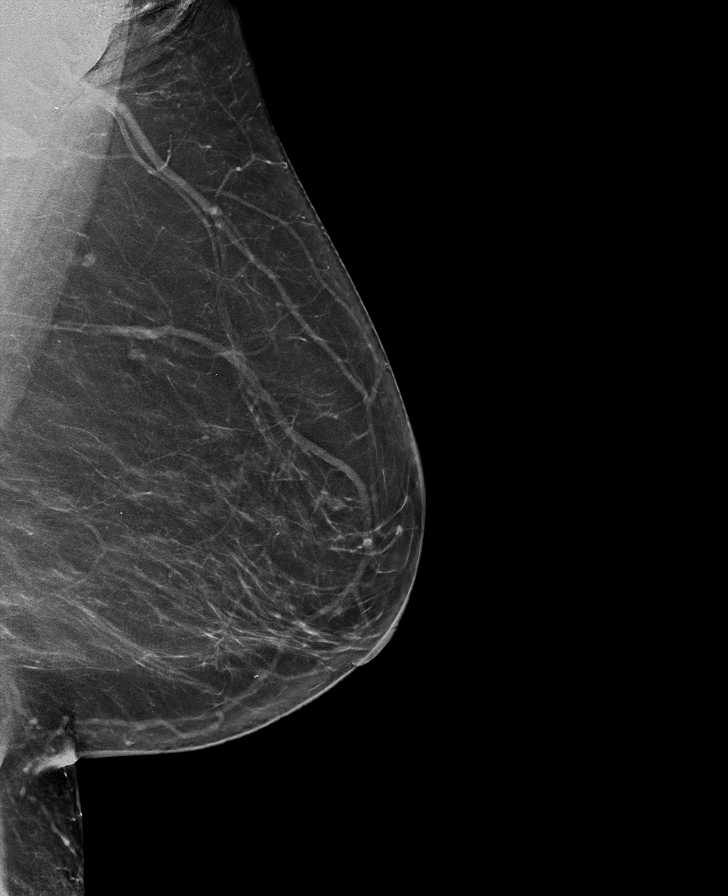

[R MLO synth-2D]
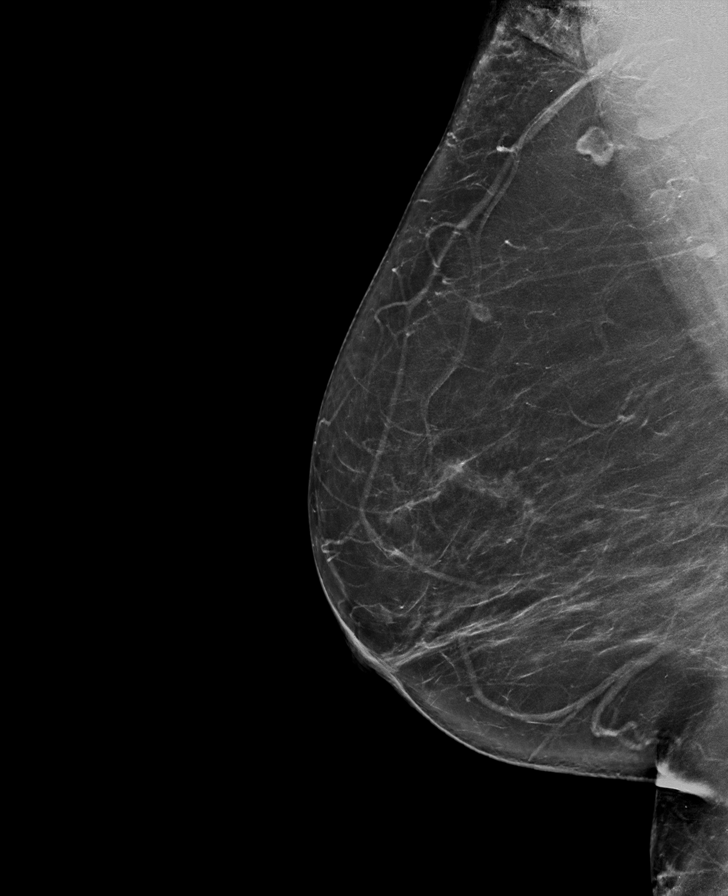

[R MLO tomo · tomo slice 41/80.0]
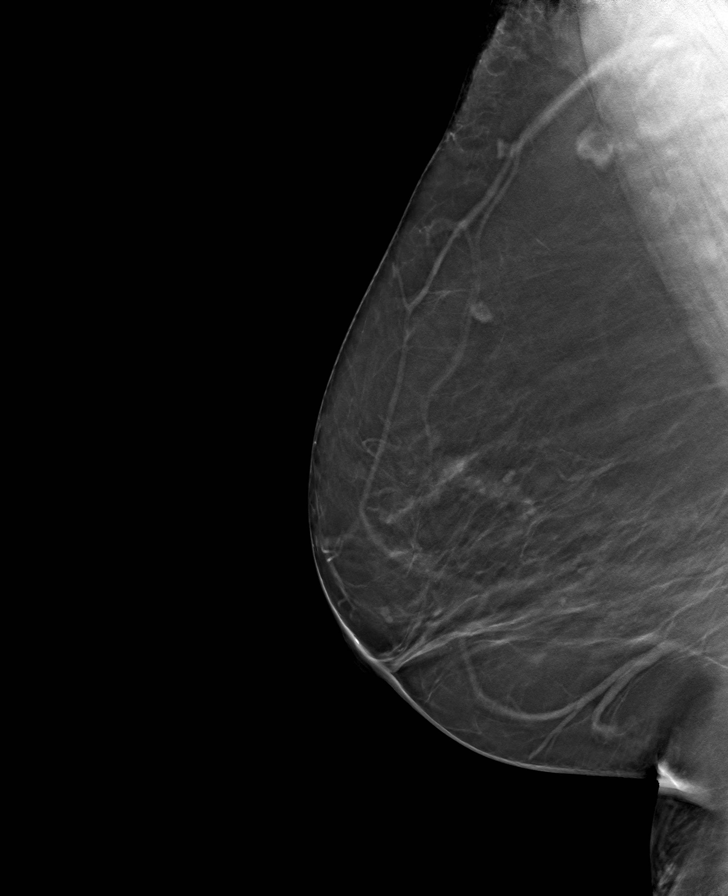

[L CC tomo · tomo slice 35/70.0]
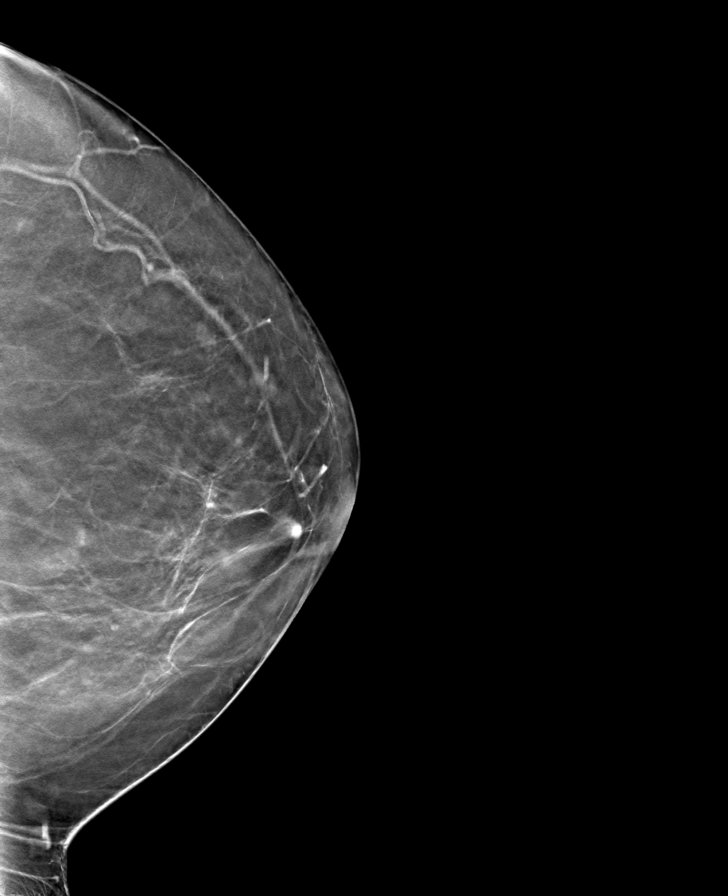

[L MLO tomo · tomo slice 38/75.0]
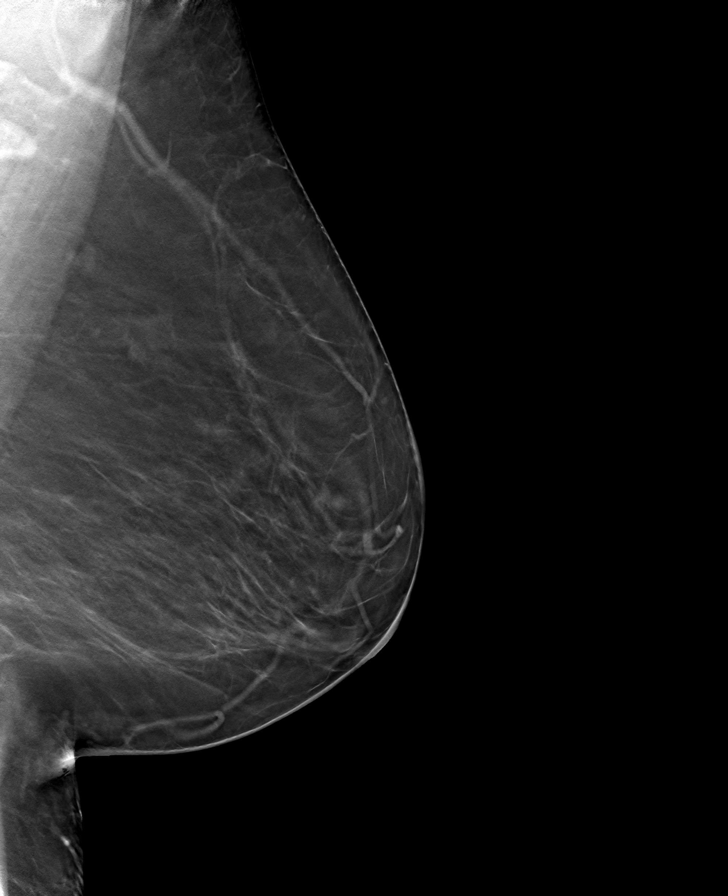

[R CC tomo · tomo slice 36/71.0]
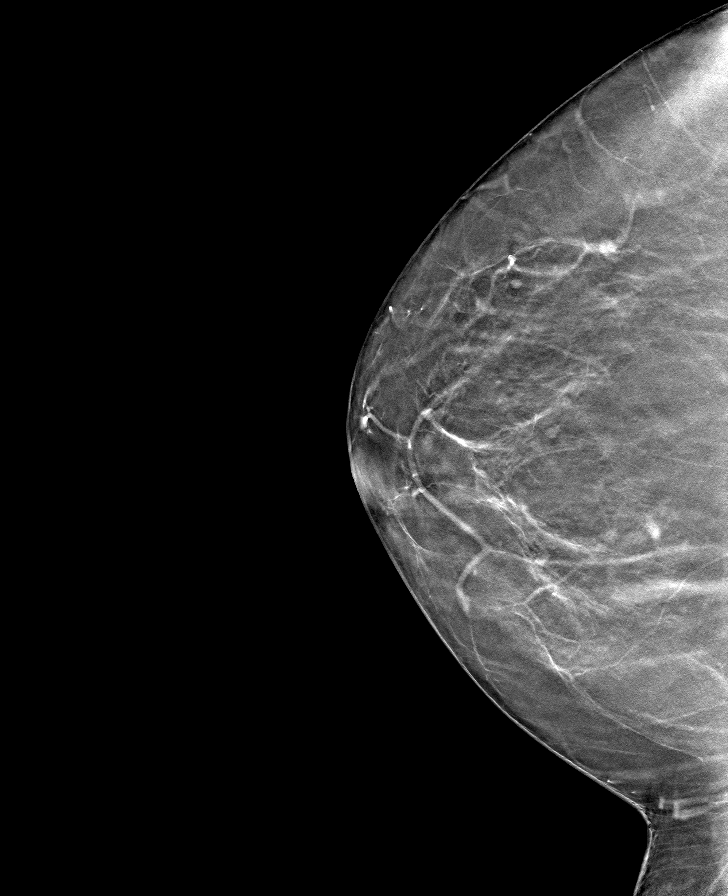

[8 of 24 positions shown; findings below may reference images not displayed]

ACR Breast Density Category b: There are scattered areas of
fibroglandular density.
FINDINGS: There are no findings suspicious for malignancy. Images were
processed with CAD.
IMPRESSION: No mammographic evidence of malignancy. A result letter of this
screening mammogram will be mailed directly to the patient.

RECOMMENDATION:
Screening mammogram in one year. (Code:[id number])

BI-RADS CATEGORY  1: Negative.

## 2021-01-29 ENCOUNTER — Other Ambulatory Visit: Payer: Self-pay | Admitting: Family Medicine

## 2021-01-30 ENCOUNTER — Other Ambulatory Visit: Payer: Self-pay | Admitting: Family Medicine

## 2021-01-30 DIAGNOSIS — Z1231 Encounter for screening mammogram for malignant neoplasm of breast: Secondary | ICD-10-CM

## 2021-03-26 ENCOUNTER — Ambulatory Visit
Admission: RE | Admit: 2021-03-26 | Discharge: 2021-03-26 | Disposition: A | Discharge status: Home / Self Care | Payer: BLUE CROSS/BLUE SHIELD | Source: Ambulatory Visit | Attending: Family Medicine | Admitting: Family Medicine

## 2021-03-26 ENCOUNTER — Other Ambulatory Visit: Payer: Self-pay

## 2021-03-26 DIAGNOSIS — Z1231 Encounter for screening mammogram for malignant neoplasm of breast: Secondary | ICD-10-CM | POA: Insufficient documentation

## 2022-02-01 IMAGING — MG DIGITAL SCREENING BILAT W/ TOMO W/ CAD
8 series · 8 of 24 positions shown · non-contrast
Comparison: Previous exam(s).

CLINICAL DATA: Screening.

EXAM:
DIGITAL SCREENING BILATERAL MAMMOGRAM WITH TOMO AND CAD

[R MLO synth-2D]
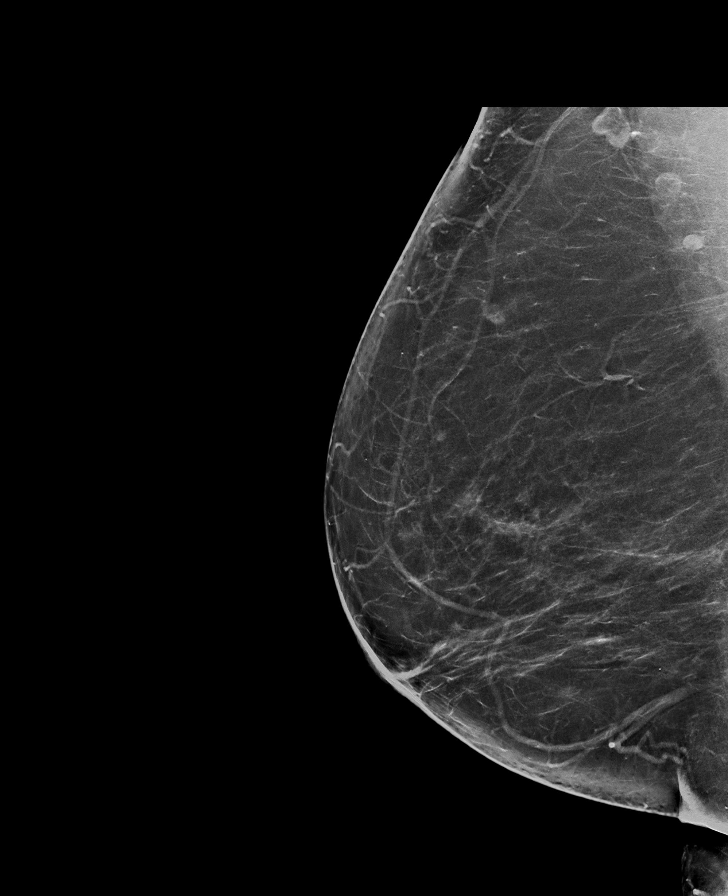

[R CC synth-2D]
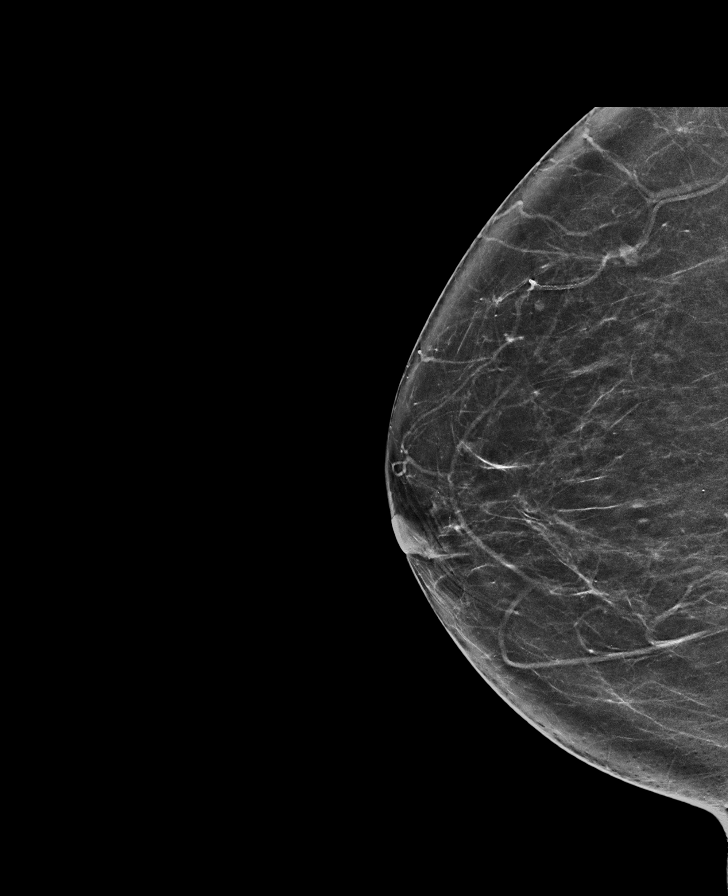

[L CC synth-2D]
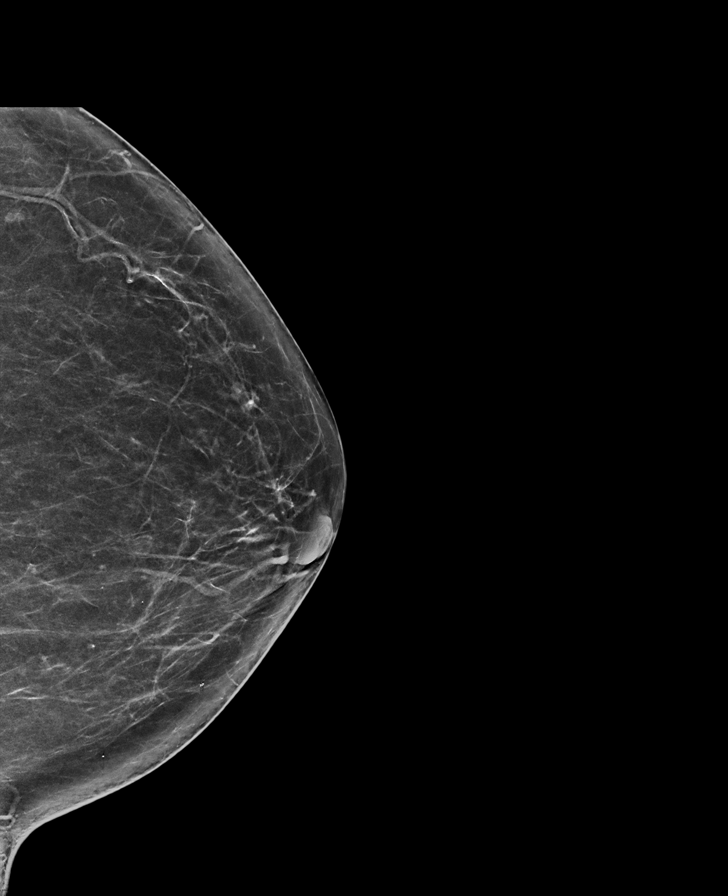

[L MLO synth-2D]
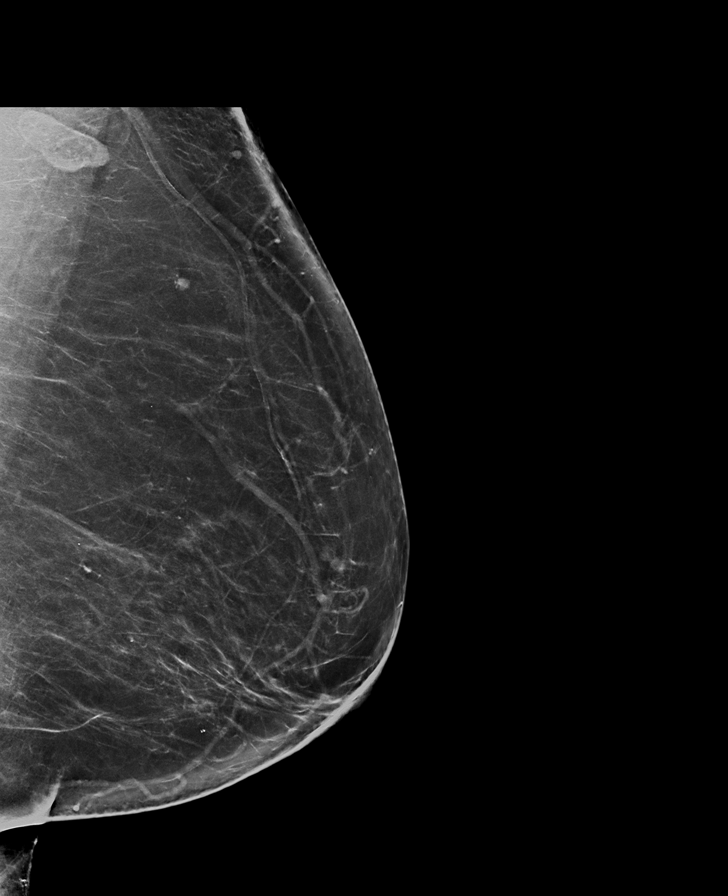

[L MLO tomo · tomo slice 41/81.0]
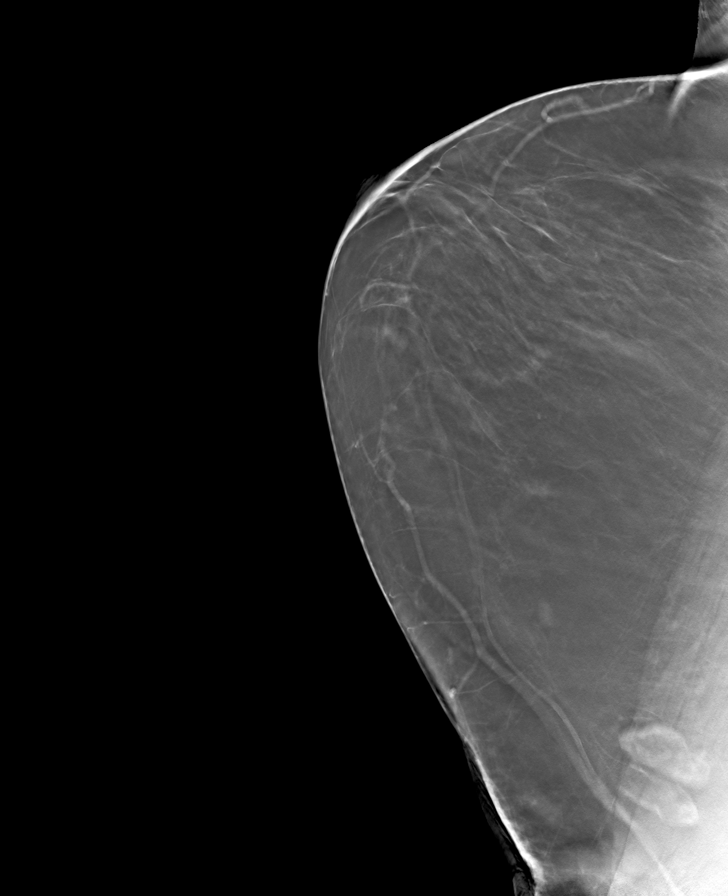

[R CC tomo · tomo slice 35/69.0]
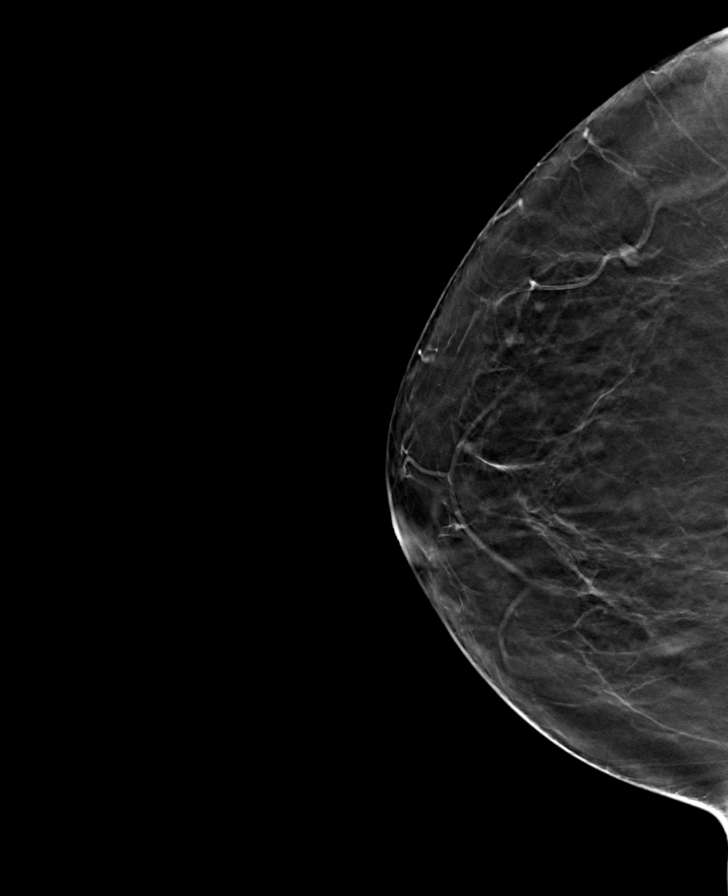

[R MLO tomo · tomo slice 39/78.0]
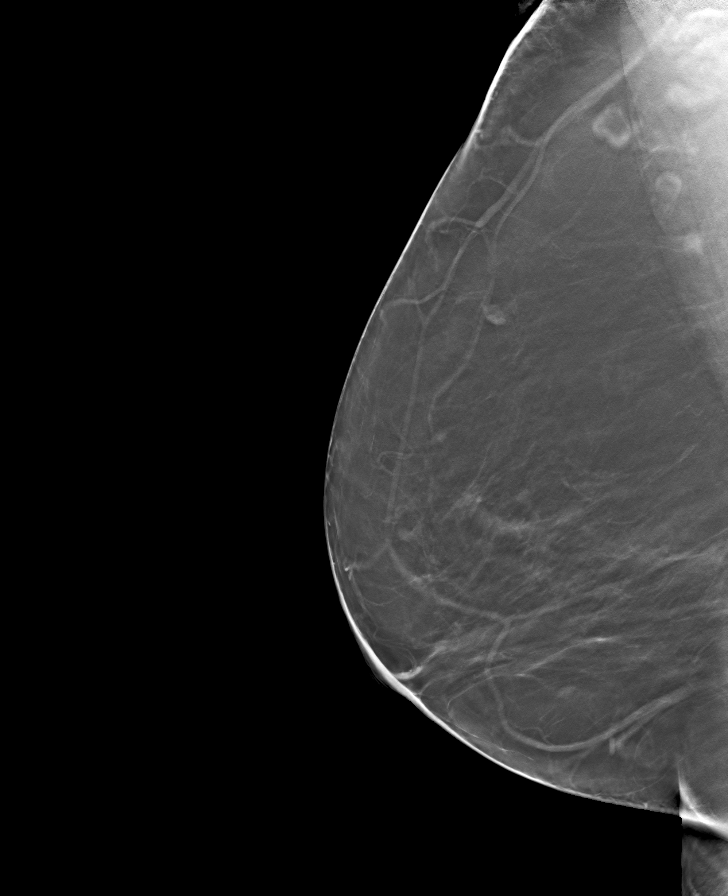

[L CC tomo · tomo slice 35/68.0]
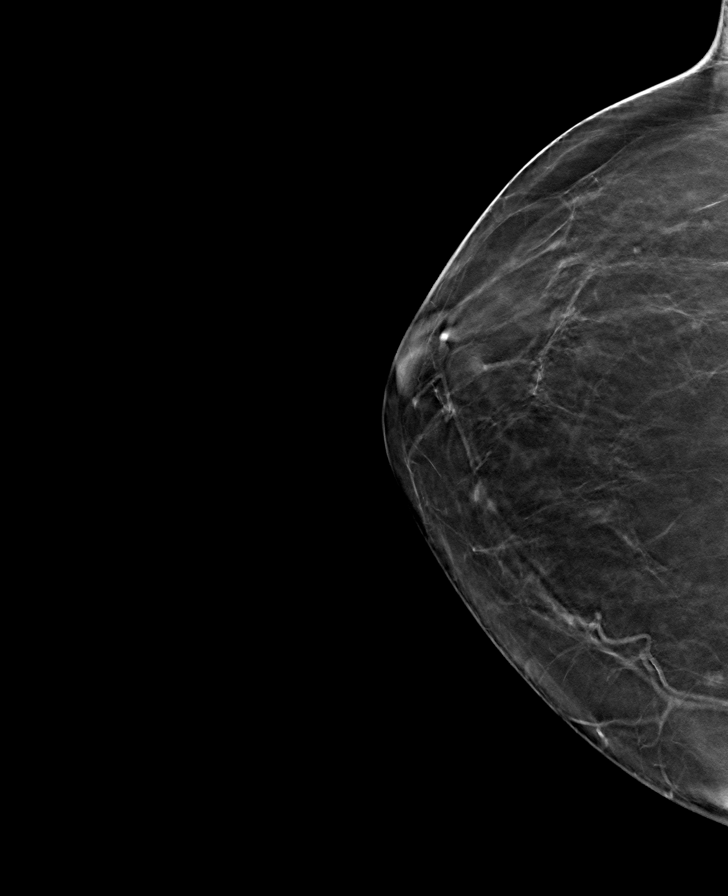

[8 of 24 positions shown; findings below may reference images not displayed]

ACR Breast Density Category b: There are scattered areas of
fibroglandular density.
FINDINGS: There are no findings suspicious for malignancy. Images were
processed with CAD.
IMPRESSION: No mammographic evidence of malignancy. A result letter of this
screening mammogram will be mailed directly to the patient.

RECOMMENDATION:
Screening mammogram in one year. (Code:[id number])

BI-RADS CATEGORY  1: Negative.

## 2022-02-26 ENCOUNTER — Other Ambulatory Visit: Payer: Self-pay | Admitting: Family Medicine

## 2022-02-26 DIAGNOSIS — Z1231 Encounter for screening mammogram for malignant neoplasm of breast: Secondary | ICD-10-CM

## 2022-03-27 ENCOUNTER — Ambulatory Visit
Admission: RE | Admit: 2022-03-27 | Discharge: 2022-03-27 | Disposition: A | Discharge status: Home / Self Care | Payer: BLUE CROSS/BLUE SHIELD | Source: Ambulatory Visit | Attending: Family Medicine | Admitting: Family Medicine

## 2022-03-27 DIAGNOSIS — Z1231 Encounter for screening mammogram for malignant neoplasm of breast: Secondary | ICD-10-CM | POA: Diagnosis not present

## 2023-02-21 IMAGING — MG MM DIGITAL SCREENING BILAT W/ TOMO AND CAD
8 series · 8 of 24 positions shown · non-contrast
Comparison: Previous exam(s).

CLINICAL DATA: Screening.

EXAM:
DIGITAL SCREENING BILATERAL MAMMOGRAM WITH TOMOSYNTHESIS AND CAD
TECHNIQUE: Bilateral screening digital craniocaudal and mediolateral oblique
mammograms were obtained. Bilateral screening digital breast
tomosynthesis was performed. The images were evaluated with
computer-aided detection.

[L CC synth-2D]
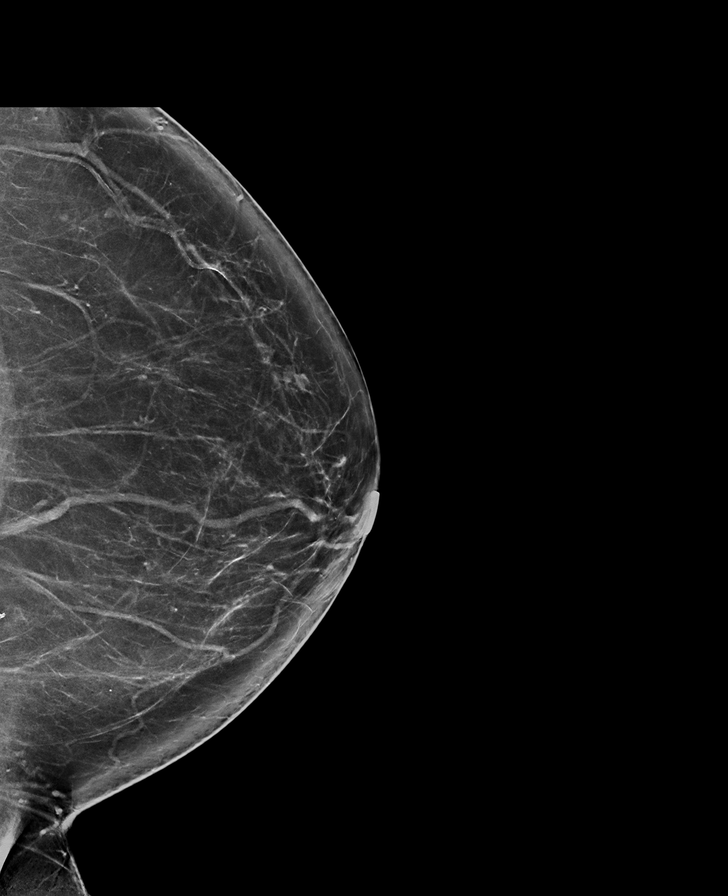

[R CC synth-2D]
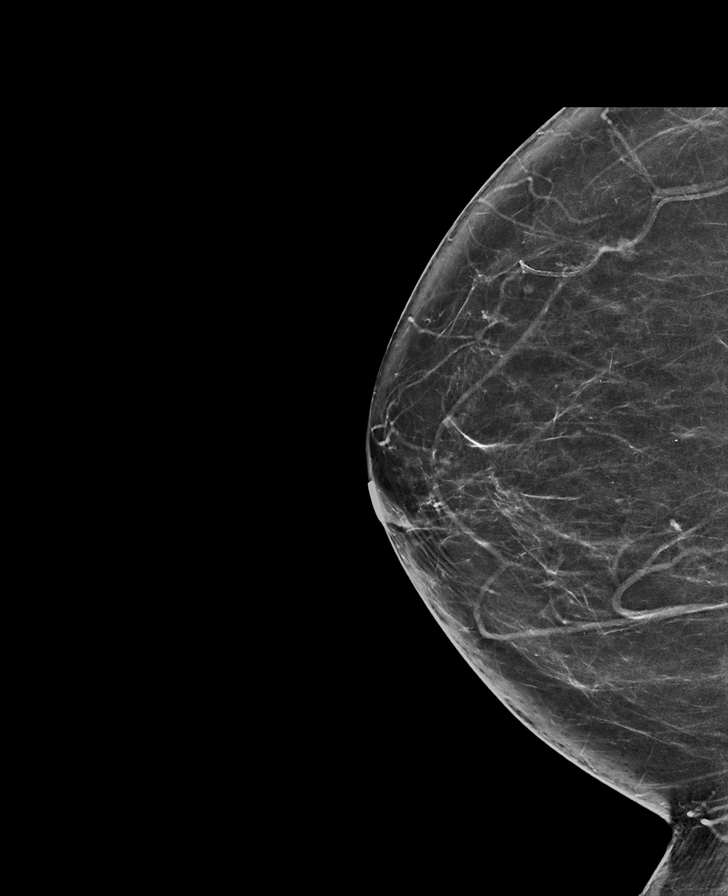

[L MLO synth-2D]
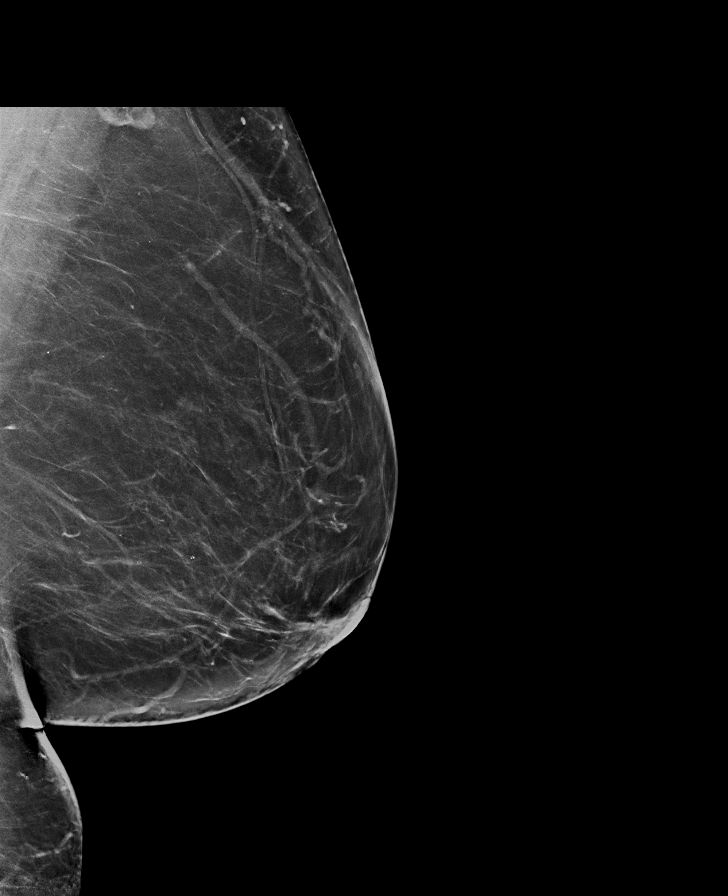

[R MLO synth-2D]
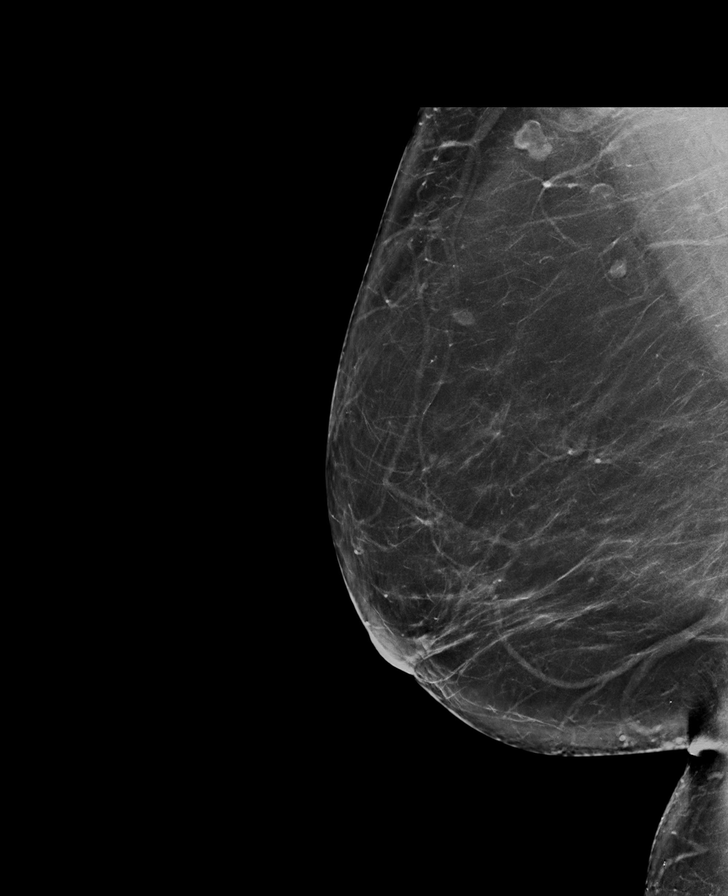

[R CC tomo · tomo slice 37/72.0]
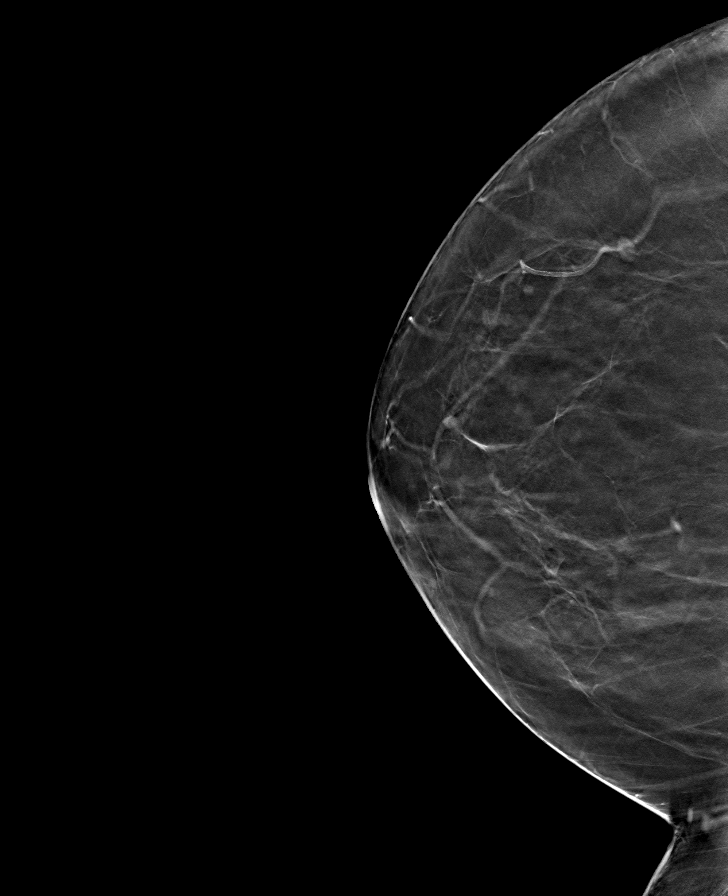

[L MLO tomo · tomo slice 43/86.0]
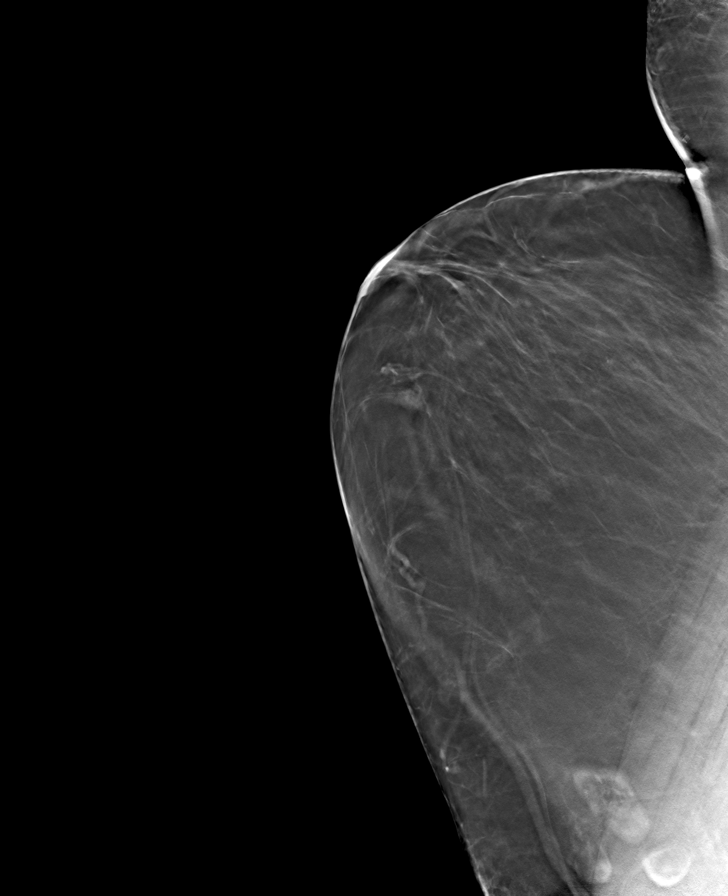

[R MLO tomo · tomo slice 46/91.0]
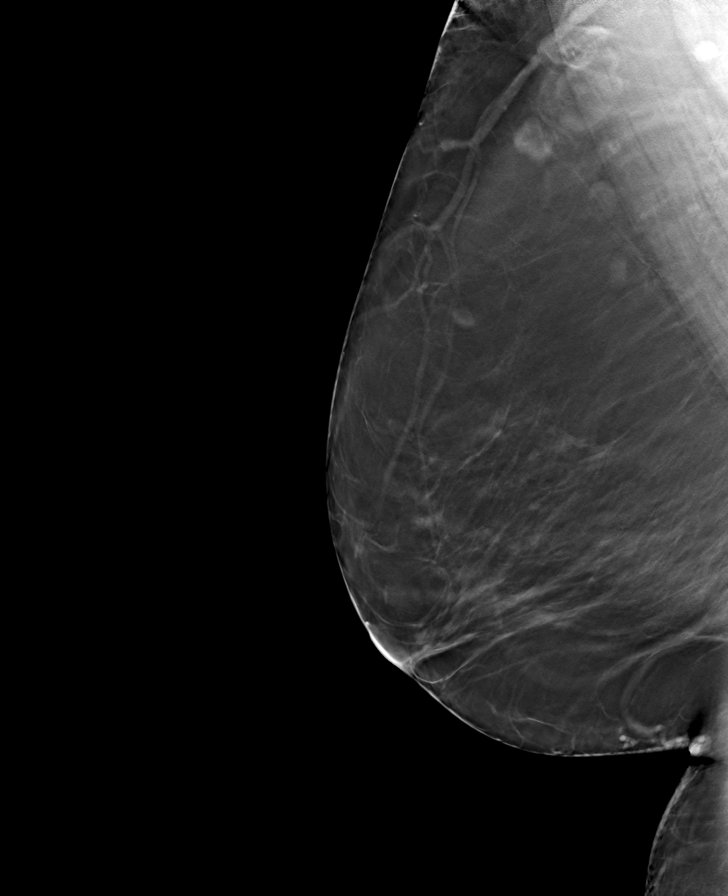

[L CC tomo · tomo slice 41/80.0]
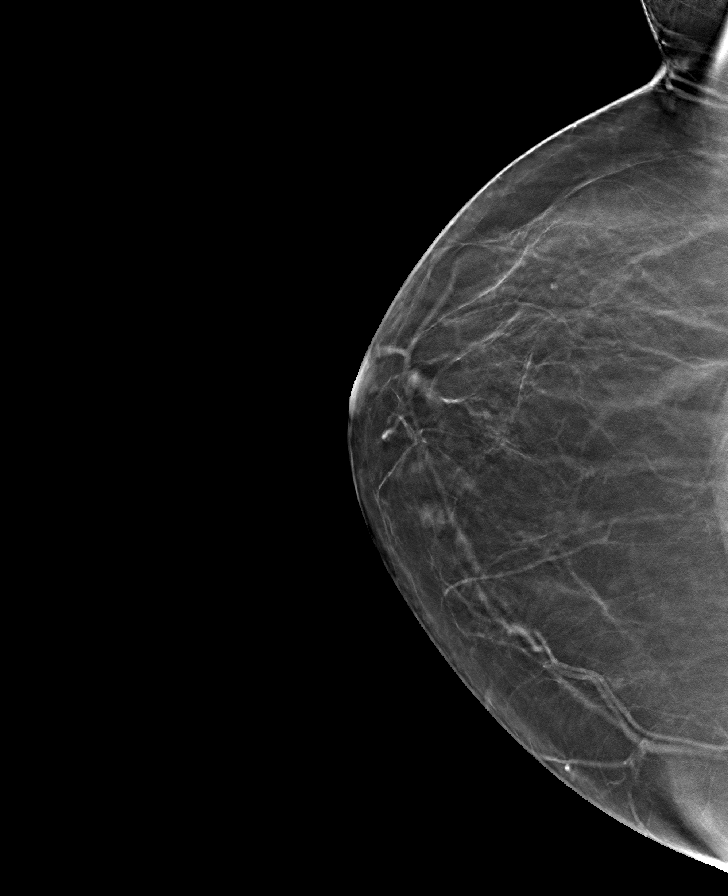

[8 of 24 positions shown; findings below may reference images not displayed]

ACR Breast Density Category b: There are scattered areas of
fibroglandular density.
FINDINGS: There are no findings suspicious for malignancy.
IMPRESSION: No mammographic evidence of malignancy. A result letter of this
screening mammogram will be mailed directly to the patient.

RECOMMENDATION:
Screening mammogram in one year. (Code:[id number])

BI-RADS CATEGORY  1: Negative.

## 2023-03-19 ENCOUNTER — Other Ambulatory Visit: Payer: Self-pay | Admitting: Family Medicine

## 2023-03-19 DIAGNOSIS — Z1231 Encounter for screening mammogram for malignant neoplasm of breast: Secondary | ICD-10-CM

## 2023-03-31 ENCOUNTER — Ambulatory Visit
Admission: RE | Admit: 2023-03-31 | Discharge: 2023-03-31 | Disposition: A | Discharge status: Home / Self Care | Payer: BLUE CROSS/BLUE SHIELD | Source: Ambulatory Visit | Attending: Family Medicine | Admitting: Family Medicine

## 2023-03-31 DIAGNOSIS — Z1231 Encounter for screening mammogram for malignant neoplasm of breast: Secondary | ICD-10-CM | POA: Diagnosis present

## 2024-03-27 ENCOUNTER — Other Ambulatory Visit: Payer: Self-pay | Admitting: Family Medicine

## 2024-03-27 DIAGNOSIS — Z1231 Encounter for screening mammogram for malignant neoplasm of breast: Secondary | ICD-10-CM

## 2024-04-12 ENCOUNTER — Ambulatory Visit
Admission: RE | Admit: 2024-04-12 | Discharge: 2024-04-12 | Disposition: A | Discharge status: Home / Self Care | Source: Ambulatory Visit | Attending: Family Medicine | Admitting: Family Medicine

## 2024-04-12 DIAGNOSIS — Z1231 Encounter for screening mammogram for malignant neoplasm of breast: Secondary | ICD-10-CM | POA: Insufficient documentation
